# Patient Record
Sex: Female | Born: 1950
Health system: Southern US, Community
[De-identification: ages and names within clinical notes are randomized; demographics above are authoritative.]

## PROBLEM LIST (undated history)

## (undated) DIAGNOSIS — I10 Essential (primary) hypertension: Secondary | ICD-10-CM

## (undated) DIAGNOSIS — N200 Calculus of kidney: Secondary | ICD-10-CM

## (undated) HISTORY — DX: Essential (primary) hypertension: I10

## (undated) HISTORY — DX: Calculus of kidney: N20.0

## (undated) HISTORY — PX: LAPAROSCOPY: SHX197

---

## 1998-12-29 HISTORY — PX: OTHER SURGICAL HISTORY: SHX169

## 1999-06-13 ENCOUNTER — Other Ambulatory Visit: Admission: RE | Admit: 1999-06-13 | Discharge: 1999-06-13 | Payer: Self-pay | Admitting: Obstetrics and Gynecology

## 2000-03-31 ENCOUNTER — Encounter: Admission: RE | Admit: 2000-03-31 | Discharge: 2000-03-31 | Payer: Self-pay | Admitting: Urology

## 2000-03-31 ENCOUNTER — Encounter: Payer: Self-pay | Admitting: Urology

## 2000-08-11 ENCOUNTER — Other Ambulatory Visit: Admission: RE | Admit: 2000-08-11 | Discharge: 2000-08-11 | Payer: Self-pay | Admitting: Obstetrics and Gynecology

## 2001-08-12 ENCOUNTER — Other Ambulatory Visit: Admission: RE | Admit: 2001-08-12 | Discharge: 2001-08-12 | Payer: Self-pay | Admitting: *Deleted

## 2001-08-13 ENCOUNTER — Encounter: Payer: Self-pay | Admitting: Urology

## 2001-08-13 ENCOUNTER — Encounter: Admission: RE | Admit: 2001-08-13 | Discharge: 2001-08-13 | Payer: Self-pay | Admitting: Urology

## 2003-04-04 ENCOUNTER — Other Ambulatory Visit: Admission: RE | Admit: 2003-04-04 | Discharge: 2003-04-04 | Payer: Self-pay | Admitting: Obstetrics and Gynecology

## 2004-09-19 ENCOUNTER — Other Ambulatory Visit: Admission: RE | Admit: 2004-09-19 | Discharge: 2004-09-19 | Payer: Self-pay | Admitting: Obstetrics and Gynecology

## 2005-01-09 ENCOUNTER — Encounter (INDEPENDENT_AMBULATORY_CARE_PROVIDER_SITE_OTHER): Payer: Self-pay | Admitting: Specialist

## 2005-01-09 ENCOUNTER — Ambulatory Visit (HOSPITAL_COMMUNITY): Admission: RE | Admit: 2005-01-09 | Discharge: 2005-01-09 | Payer: Self-pay | Admitting: Gastroenterology

## 2005-10-21 ENCOUNTER — Other Ambulatory Visit: Admission: RE | Admit: 2005-10-21 | Discharge: 2005-10-21 | Payer: Self-pay | Admitting: Obstetrics and Gynecology

## 2005-12-10 ENCOUNTER — Encounter: Payer: Self-pay | Admitting: Cardiology

## 2005-12-10 ENCOUNTER — Ambulatory Visit: Payer: Self-pay | Admitting: Cardiology

## 2005-12-10 ENCOUNTER — Inpatient Hospital Stay (HOSPITAL_COMMUNITY): Admission: EM | Admit: 2005-12-10 | Discharge: 2005-12-11 | Payer: Self-pay | Admitting: Emergency Medicine

## 2006-01-02 ENCOUNTER — Ambulatory Visit: Payer: Self-pay | Admitting: Internal Medicine

## 2006-02-05 ENCOUNTER — Ambulatory Visit: Payer: Self-pay | Admitting: Internal Medicine

## 2006-08-06 ENCOUNTER — Ambulatory Visit: Payer: Self-pay | Admitting: Internal Medicine

## 2006-12-04 ENCOUNTER — Other Ambulatory Visit: Admission: RE | Admit: 2006-12-04 | Discharge: 2006-12-04 | Payer: Self-pay | Admitting: Obstetrics & Gynecology

## 2006-12-11 ENCOUNTER — Ambulatory Visit: Payer: Self-pay | Admitting: Cardiology

## 2007-05-03 ENCOUNTER — Encounter: Admission: RE | Admit: 2007-05-03 | Discharge: 2007-05-03 | Payer: Self-pay | Admitting: Obstetrics and Gynecology

## 2007-07-01 ENCOUNTER — Ambulatory Visit: Payer: Self-pay | Admitting: Internal Medicine

## 2008-06-05 ENCOUNTER — Encounter: Admission: RE | Admit: 2008-06-05 | Discharge: 2008-06-05 | Payer: Self-pay | Admitting: Obstetrics and Gynecology

## 2008-07-31 ENCOUNTER — Ambulatory Visit: Payer: Self-pay | Admitting: Internal Medicine

## 2008-08-08 ENCOUNTER — Other Ambulatory Visit: Admission: RE | Admit: 2008-08-08 | Discharge: 2008-08-08 | Payer: Self-pay | Admitting: Obstetrics and Gynecology

## 2009-05-14 ENCOUNTER — Telehealth: Payer: Self-pay | Admitting: Internal Medicine

## 2009-05-14 ENCOUNTER — Ambulatory Visit: Payer: Self-pay | Admitting: Cardiovascular Disease

## 2009-05-14 DIAGNOSIS — R072 Precordial pain: Secondary | ICD-10-CM | POA: Insufficient documentation

## 2009-05-31 ENCOUNTER — Encounter: Payer: Self-pay | Admitting: Cardiology

## 2009-05-31 ENCOUNTER — Ambulatory Visit: Payer: Self-pay | Admitting: Internal Medicine

## 2009-06-01 DIAGNOSIS — I10 Essential (primary) hypertension: Secondary | ICD-10-CM | POA: Insufficient documentation

## 2009-06-11 ENCOUNTER — Encounter: Payer: Self-pay | Admitting: Cardiovascular Disease

## 2009-06-11 ENCOUNTER — Ambulatory Visit: Payer: Self-pay

## 2009-06-26 ENCOUNTER — Ambulatory Visit: Payer: Self-pay | Admitting: Internal Medicine

## 2009-09-30 ENCOUNTER — Observation Stay (HOSPITAL_COMMUNITY): Admission: EM | Admit: 2009-09-30 | Discharge: 2009-10-01 | Payer: Self-pay | Admitting: Emergency Medicine

## 2009-09-30 ENCOUNTER — Ambulatory Visit: Payer: Self-pay | Admitting: Cardiology

## 2009-10-17 ENCOUNTER — Ambulatory Visit: Payer: Self-pay | Admitting: Internal Medicine

## 2011-04-03 LAB — BASIC METABOLIC PANEL
BUN: 5 mg/dL — ABNORMAL LOW (ref 6–23)
CO2: 24 mEq/L (ref 19–32)
CO2: 26 mEq/L (ref 19–32)
CO2: 28 mEq/L (ref 19–32)
Calcium: 9.1 mg/dL (ref 8.4–10.5)
Calcium: 9.5 mg/dL (ref 8.4–10.5)
Calcium: 9.6 mg/dL (ref 8.4–10.5)
Chloride: 104 mEq/L (ref 96–112)
Chloride: 107 mEq/L (ref 96–112)
Chloride: 109 mEq/L (ref 96–112)
Creatinine, Ser: 0.58 mg/dL (ref 0.4–1.2)
Creatinine, Ser: 0.65 mg/dL (ref 0.4–1.2)
GFR calc Af Amer: >60 mL/min (ref >60)
GFR calc Af Amer: >60 mL/min (ref >60)
GFR calc non Af Amer: >60 mL/min (ref >60)
GFR calc non Af Amer: >60 mL/min (ref >60)
GFR calc non Af Amer: >60 mL/min (ref >60)
Glucose, Bld: 102 mg/dL — ABNORMAL HIGH (ref 70–99)
Glucose, Bld: 111 mg/dL — ABNORMAL HIGH (ref 70–99)
Glucose, Bld: 116 mg/dL — ABNORMAL HIGH (ref 70–99)
Glucose, Bld: 116 mg/dL — ABNORMAL HIGH (ref 70–99)
Potassium: 3.4 mEq/L — ABNORMAL LOW (ref 3.5–5.1)
Sodium: 140 mEq/L (ref 135–145)
Sodium: 142 mEq/L (ref 135–145)

## 2011-04-03 LAB — CBC
Hemoglobin: 11.9 g/dL — ABNORMAL LOW (ref 12.0–15.0)
Hemoglobin: 12.3 g/dL (ref 12.0–15.0)
Hemoglobin: 12.8 g/dL (ref 12.0–15.0)
MCHC: 33.8 g/dL (ref 30.0–36.0)
MCV: 96.6 fL (ref 78.0–100.0)
MCV: 97 fL (ref 78.0–100.0)
Platelets: 213 10*3/uL (ref 150–400)
Platelets: 234 10*3/uL (ref 150–400)
Platelets: 241 10*3/uL (ref 150–400)
RBC: 3.63 MIL/uL — ABNORMAL LOW (ref 3.87–5.11)
RDW: 12.3 % (ref 11.5–15.5)
RDW: 12.7 % (ref 11.5–15.5)

## 2011-04-03 LAB — DIFFERENTIAL
Basophils Relative: 1 % (ref 0–1)
Eosinophils Absolute: 0.1 10*3/uL (ref 0.0–0.7)
Eosinophils Relative: 1 % (ref 0–5)
Lymphocytes Relative: 46 % (ref 12–46)
Lymphs Abs: 3.8 10*3/uL (ref 0.7–4.0)
Monocytes Absolute: 0.7 10*3/uL (ref 0.1–1.0)
Monocytes Relative: 9 % (ref 3–12)
Neutrophils Relative %: 45 % (ref 43–77)

## 2011-04-03 LAB — PROTIME-INR
INR: 1.1 (ref 0.00–1.49)
Prothrombin Time: 13.7 seconds (ref 11.6–15.2)

## 2011-04-03 LAB — CARDIAC PANEL(CRET KIN+CKTOT+MB+TROPI)
Relative Index: INVALID (ref 0.0–2.5)
Total CK: 43 U/L (ref 7–177)
Troponin I: 0.02 ng/mL (ref 0.00–0.06)

## 2011-04-03 LAB — CK TOTAL AND CKMB (NOT AT ARMC): Total CK: 43 U/L (ref 7–177)

## 2011-04-03 LAB — POCT CARDIAC MARKERS

## 2011-05-13 NOTE — Assessment & Plan Note (Signed)
[facility]                            CARDIOLOGY OFFICE NOTE   Jeanne Wilson, Jeanne Wilson                        MRN:          [id number]  DATE:07/31/2008                            DOB:          July 12, 1951    PRIMARY CARE PHYSICIAN:  Jeanne Likes, MD.   INTERVAL HISTORY:  Jeanne Wilson is a very pleasant 60 year old woman with a  history of chest pain and a very abnormal EKG.  She had a cardiac  catheterization in December 2006, which showed normal coronaries and  good LV function.  She also has a history of borderline hypertension and  follows with Dr. Lorenz Wilson.   She returns today for routine followup.  We have not seen her since  December 2007.  She is doing great.  She is active with yoga and low-  impact aerobics without any chest pain or shortness of breath.  She has  been taking her blood pressures at home and systolic had been running  from 128 to 132.   CURRENT MEDICATIONS:  1. Aspirin 81.  2. Prilosec 20 a day.   PHYSICAL EXAMINATION:  VITAL SIGNS:  She is well appearing in no acute  distress, ambulatory on the clinic without any respiratory difficulty.  VITAL SIGNS:  Blood pressure is 135/70, heart rate is 62, weight is 138.  HEENT:  Normal.  NECK:  Supple.  No JVD.  Carotids are 2+ bilaterally.  No bruits.  There  is no lymphadenopathy or thyromegaly.  CARDIAC:  She has a regular rate and rhythm.  No murmurs, rubs, or  gallops.  LUNGS:  Clear.  ABDOMEN:  Soft, nontender, nondistended.  No hepatosplenomegaly, no  bruits, no masses.  Good bowel sounds.  EXTREMITIES:  Warm with no cyanosis, clubbing, or edema.  No cords.  No  rash.  NEURO:  Alert and x3.  Cranial nerves II-XII are intact.  Moves all 4  extremities without difficulty.  Affect is very pleasant.   EKG shows normal sinus rhythm at a rate of 62.  There is LVH and marked  repolarization abnormalities with diffuse ST depression throughout.   ASSESSMENT AND PLAN:  1. Chest pain  with markedly abnormal EKG.  She has normal coronaries.      She is asymptomatic.  She is keeping an EKG in her wallet.      Continue low-dose aspirin.  2. Hypertension.  This is borderline.  We will continue following her      blood pressures at home.  Should her systolic blood pressure be      consistently over 130 or 135, I do think it is reasonable to start      back on low-dose Norvasc at 2.5 a day.  She will follow up with Dr.      Lorenz Wilson.   DISPOSITION:  She is doing great.  We will see her back in 18 months for  routine followup.     Jeanne Buckles. Bensimhon, MD  Electronically Signed    DRB/MedQ  DD: 07/31/2008  DT: 08/01/2008  Job #: [id number]   cc:  Jeanne Wilson, M.D.

## 2011-05-13 NOTE — Assessment & Plan Note (Signed)
[facility]                            CARDIOLOGY OFFICE NOTE   BEYOUNCE, DICKENS                        MRN:          [id number]  DATE:07/01/2007                            DOB:          February 12, 1951    PRIMARY CARE PHYSICIAN:  [facility] [address]   HISTORY OF PRESENT ILLNESS:  Ms. Don is a very pleasant 60 year old  woman with a history of chest pain with normal coronary arteries by  catheterization in 2006, questionable history of coronary vasospasm. She  was seen in our office in December 2007 with recurrent chest pain. That  is the time that her mother was very sick. She had been restarted on her  Norvasc and Imdur but stopped this after 2 weeks. She has not taken it  since. She says that she feels fine. She is very active with no chest  pain. No shortness of breath.   CURRENT MEDICATIONS:  Aspirin 81, Prilosec 20.   PHYSICAL EXAMINATION:  GENERAL:  She is well appearing. No acute  distress. Ambulates around the clinic without any respiratory  difficulty.  VITAL SIGNS:  Blood pressure 130/70, however, she says at home it is  always 105 to the 1-teens. Heart rate 69. Weigh 142.  HEENT:  Normal.  NECK:  Supple. No JVD. Carotids are 2+ bilaterally without any bruits.  There is no lymphadenopathy or thyromegaly.  CARDIAC:  Regular rate and rhythm. No murmur, rub, or gallop.  LUNGS:  Clear.  ABDOMEN:  Soft, nontender, and nondistended. No hepatosplenomegaly. No  bruits, no masses. Good bowel sounds.  EXTREMITIES:  Warm with no clubbing, cyanosis, or edema.  NEUROLOGIC:  Alert and oriented times three. Cranial nerves 2-12 are  intact. Moves all 4 extremities without difficulty. Affect is very  pleasant.   LABORATORY DATA:  EKG shows normal sinus rhythm at a rate of 69 with LVH  and diffuse STT wave changes, suggestive of repolarization  abnormalities.   ASSESSMENT/PLAN:  1. Chest pain, possibly secondary to coronary vasospasm. This  has      resolved. No further workup needed at this time. She does have prn      nitroglycerin and I reminded her to refresh this, to keep a fresh      supply in the house.  2. Hypertension. Blood pressure's on the high end of normal here but      well controlled at home. She will followup with her primary care      physician.   DISPOSITION:  Return to clinic in 1 year or as needed.     Bevelyn Buckles. Bensimhon, MD  Electronically Signed    DRB/MedQ  DD: 07/01/2007  DT: 07/02/2007  Job #: [id number]   cc:   [facility], [address]

## 2011-05-16 NOTE — Cardiovascular Report (Signed)
[name], Jeanne Wilson                 ACCOUNT NO.:  [id number]   MEDICAL RECORD NO.:  [id number]          PATIENT TYPE:  INP   LOCATION:  2925                         FACILITY:  [facility]   PHYSICIAN:  Arturo Morton. Riley Kill, M.D. [facility] OF BIRTH:  [birth year]   DATE OF PROCEDURE:  12/10/2005  DATE OF DISCHARGE:  12/11/2005                              CARDIAC CATHETERIZATION   INDICATIONS:  The patient was brought to the catheterization laboratory  urgently by Dr. Gala Romney. She presented with chest pain and symptoms  worrisome for cardiac etiology. The current study was done to assess  coronary anatomy.   DESCRIPTION OF PROCEDURE:  The patient was brought to the catheterization  laboratory after informed consent. She was prepped and draped in the usual  fashion. Through an anterior puncture, the right femoral artery was easily  entered. A 6-French sheath was placed. She was given 600 milligrams of oral  clopidogrel by the protocol. Views of the right and left coronary arteries  were obtained in multiple angiographic projections. Central aortic and left  ventricular pressures were measured with pigtail. Dr. Gala Romney and I have  reviewed the films. The patient was transferred to the holding area in  satisfactory clinical condition.   HEMODYNAMIC DATA:  1.  Central aortic pressure was 152/79, mean 110.  2.  Left ventricular pressure 149/17.  3.  No gradient on pullback across the aortic valve.   ANGIOGRAPHIC DATA:  1.  The right coronary was a nondominant vessel that was smooth throughout      its course without significant focal narrowing.  2.  The left main demonstrated a very short left main and was without      significant narrowing.  3.  The circumflex is a dominant vessel. There is a major marginal branch      that bifurcates distally over the anterolateral segment and was free of      significant disease. The distal vessel terminated as two posterolateral      branches in the PDA  coming from the left system, and no significant      narrowing was noted.  4.  The left anterior descending artery provided a major bifurcating      diagonal branch. The diagonal branch was free of critical disease. The      LAD itself was really quite tortuous but again there was not obvious      abnormality.  5.  Ventriculography was associated with some ectopy during injection making      calculation of ejection fraction formally and possible; however, there      was no definite wall motion abnormality.   CONCLUSION:  1.  Preserved overall left ventricular function.  2.  Left dominant system with no obvious high-grade critical obstruction.   The patient will be taken to the holding area for convalescent care. Serial  enzymes and D-dimer will be obtained. The patient may need to have a CT scan  to evaluate her aorta. On the angiogram, there is no obvious evidence of  aortic dissection and the overall aortic caliber appears to be normal.  Further evaluation will be directed by Dr. Gala Romney.      Arturo Morton. Riley Kill, M.D. [facility]  Electronically Signed     TDS/MEDQ  D:  01/10/2006  T:  01/10/2006  Job:  [id number]

## 2011-05-16 NOTE — H&P (Signed)
Jeanne Wilson NO.:  [id number]   MEDICAL RECORD NO.:  [id number]          PATIENT TYPE:  EMS   LOCATION:  MAJO                         FACILITY:  [facility]   PHYSICIAN:  Arvilla Meres, M.D. LHCDATE OF BIRTH:  June 15, 1951   DATE OF ADMISSION:  12/10/2005  DATE OF DISCHARGE:                                HISTORY & PHYSICAL   PRIMARY CARDIOLOGIST:  Dr. Arvilla Meres (new).   REASON FOR ADMISSION:  Jeanne Wilson is a 60 year old female with no prior year old female with no prior  cardiac history and no significant cardiac risk factors who presents to the  emergency room with a 1-week history of intermittent chest pain associated  with right arm discomfort. Electrocardiogram on admission is suggestive of  anteroseptal myocardial infarction.   The patient states that for the last week or so she has felt a fluttering  sensation in the middle of the chest which is then followed by discomfort  radiating to her right upper extremity and into the right shoulder. This  appears to be precipitated by exertion. She initially thought that this  might be bronchitis and was experiencing a nonproductive cough as well. She  was recently placed on a 1-week course of Z-Pak which she completed.  However, her symptoms have persisted and this morning, at 3 a.m., she awoke  with her worst episode of chest discomfort (7/10). She drove herself to the  emergency room and electrocardiogram revealed normal sinus rhythm with 1 mm  ST elevation with coving in leads V1 and V2, and 1-2 mm ST depression in the  inferolateral leads.   ALLERGIES:  No known drug allergies.   MEDICATIONS PRIOR TO ADMISSION:  None.   PAST MEDICAL HISTORY:  History of nephrolithiasis status post lithotripsy.   SOCIAL HISTORY:  The patient lives here in [place] with her husband. She  has two boys. She is a [employer] trader.   FAMILY HISTORY:  Mother age 58, status post bilateral carotid  endarterectomy. Father age 75, no known heart disease.  Siblings have no  known heart disease.   REVIEW OF SYSTEMS:  The patient denies any history of hypertension, diabetes  mellitus, hypercholesterolemia. She denies any recent paroxysmal nocturnal  dyspnea, orthopnea, or lower extremity edema. No recent evidence of upper or  lower GI bleeding. The patient denies recent fever, chills, or productive  cough. Remaining symptoms are negative.   PHYSICAL EXAMINATION:  VITAL SIGNS:  Blood pressure 123/74, pulse 74 and  regular, respirations 18, saturations 96% on room air, temperature 98.1.  GENERAL:  A 60 year old female in no apparent distress.  HEENT:  Normocephalic, atraumatic.  NECK:  Preserved bilateral carotid pulses without bruits; no JVD.  LUNGS:  Clear to auscultation in all fields.  HEART:  Regular rate and rhythm (S1, S2). No murmurs, rubs, or gallops.  ABDOMEN:  Soft, nontender.  EXTREMITIES:  Intact peripheral pulses without edema.  NEUROLOGIC:  No focal deficit.   LABORATORY DATA:  Pending.   Electrocardiogram:  Normal sinus rhythm at 70 beats per minute with normal  axis; 1 mm ST elevation in leads V1 and V2 with coving; 1-2  mm ST depression  in the inferolateral leads.   IMPRESSION:  Jeanne Wilson is a 60 year old female with no prior cardiac history  and no significant cardiac risk factors who presents to the emergency room  with new-onset chest discomfort which has been intermittent over the last  week. She experienced her worst episode at 3 a.m. this morning and presented  to the emergency room with an electrocardiogram suggestive of anteroseptal  myocardial infarction. The patient has been treated with baby aspirin and  has been started on IV nitroglycerin and heparin in the emergency room.   PLAN:  Proceed directly to the cardiac catheterization laboratory for  emergent coronary angiogram and possible percutaneous intervention.      Gene Serpe, P.A. [facility]      Arvilla Meres, M.D. [facility]  Electronically Signed     GS/MEDQ  D:  12/10/2005  T:  12/10/2005  Job:  [id number]   cc:   [facility] Mis on [address]

## 2011-05-16 NOTE — Op Note (Signed)
Jeanne Wilson, Jeanne Wilson                 ACCOUNT NO.:  [id number]   MEDICAL RECORD NO.:  [id number]          PATIENT TYPE:  AMB   LOCATION:  ENDO                         FACILITY:  [facility]   PHYSICIAN:  Graylin Shiver, M.D.   DATE OF BIRTH:  [birth year]   DATE OF PROCEDURE:  01/09/2005  DATE OF DISCHARGE:                                 OPERATIVE REPORT   INDICATIONS:  Family history of colon polyps.   Informed consent was obtained after explanation of the risks of bleeding,  infection and perforation.   PREMEDICATION:  Fentanyl 100 mcg IV, Versed 10 milligrams IV.   PROCEDURE:  With the patient in the left lateral decubitus position, a  rectal exam was performed. No masses were felt. The Olympus colonoscope was  inserted into the rectum and advanced around the colon to the cecum. Cecal  landmarks were identified. The ileocecal valve was intubated and the first  few centimeters of terminal ileum looked normal. The cecum looked normal.  The ascending colon looked normal. The transverse colon looked normal. The  descending colon looked normal. The sigmoid showed a small 3 mm polyp  biopsied off with cold forceps. The rectum was normal. She tolerated the  procedure well without complications.   IMPRESSION:  Small sigmoid polyp.   PLAN:  The pathology will be checked.   I would recommend a follow-up colonoscopy again in 5 years.       SFG/MEDQ  D:  01/09/2005  T:  01/09/2005  Job:  [id number]   cc:   Andres Ege, M.D.  [address]  [place]  [place] [address]  Fax: [phone]

## 2011-05-16 NOTE — Assessment & Plan Note (Signed)
[facility]                              CARDIOLOGY OFFICE NOTE   IMELDA, DANDRIDGE                          MRN:          [id number]  DATE:08/06/2006                            DOB:          [birth year]    PRIMARY CARE PHYSICIAN:  [facility] [address]   PATIENT IDENTIFICATION:  Jeanne Wilson is a delightful 60 year old woman with  chest pain possibly secondary to coronary vasospasm - returns for routine  followup.   PROBLEM:  1. Severe chest pain with abnormal EKG.  Question coronary vasospasm.      a.     Cardiac catheterization summer 2006.  Normal coronary arteries.       Normal ejection fraction.      b.     Echocardiogram November 2006.  Ejection fraction 55 to 65%. No       significant valvular abnormalities.      c.     Chest CT December 2006.  No pulmonary emboli or masses.      d.     Chest pain resolved with Norvasc and Imdur.  2. Hypertension.  3. Normal cholesterol.  4. Kidney stones.   CURRENT MEDICATIONS:  1. Aspirin 81.  2. Prilosec 20.  3. Imdur 30.  4. Norvasc 5.   ALLERGIES:  No known drug allergies.   INTERVAL HISTORY:  Jeanne Wilson returns today for routine followup.  She has  done quite well.  Very occasional twinges in her chest but much reduced.  No  shortness of  breath.  She is exercising with aerobics and belly dancing.  She is asking whether or not she can come off some of her medication.   PHYSICAL EXAMINATION:  GENERAL:  Well appearing, no acute distress.  VITAL SIGNS:  Blood pressure is 116/74.  Heart rate 73.  HEENT:  Sclerae anicteric.  EOMI.  NECK:  Supple, no JVD.  Carotids 2+ bilaterally without bruits.  No  lymphadenopathy, thyromegaly.  CARDIAC:  Regular rate and rhythm.  Soft S4.  No murmurs.  LUNGS:  Clear.  ABDOMEN:  Soft, non-tender, non-distended.  No hepatosplenomegaly, no  bruits, no masses.  EXTREMITIES:  Warm.  No cyanosis, clubbing or edema.  Strong peripheral  pulses.  NEUROLOGIC:  She  is alert and oriented x3.  Cranial nerves II-XII are  intact.  She moves all four extremities without any difficulty.  Affect is  bright.   ASSESSMENT/PLAN:  1. Chest pain with __________ .  She has had a dramatic response to      Norvasc and Imdur.  I am not sure if a relief is related to coronary      spasm or esophageal spasm.  We will try to wean off of her Imdur and      see if we can just maintain her on her Norvasc, which will also control      her hypertension.  I told her that if this pain comes back she could      just go back on the Imdur.  2. Hypertension - well controlled.  Continue  Norvasc.  3. Lipids - we will check a cholesterol today.   DISPOSITION:  We will see her back in nine months for a routine followup.                                Bevelyn Buckles. Bensimhon, MD    DRB/MedQ  DD:  08/06/2006  DT:  08/06/2006  Job #:  [id number]

## 2011-05-16 NOTE — Assessment & Plan Note (Signed)
[facility]                            CARDIOLOGY OFFICE NOTE   Jeanne Wilson, Jeanne Wilson                        MRN:          [id number]  DATE:12/11/2006                            DOB:          1951-02-01    PRIMARY CARE PHYSICIAN:  [facility].   PRIMARY CARDIOLOGIST:  Bevelyn Buckles. Bensimhon, M.D.   HISTORY OF PRESENT ILLNESS:  Jeanne Wilson is a 60 year old female patient  with a history of chest pain, possibly secondary to coronary vasospasm  who returns to the office today for followup for the Horizon study.  She  had a normal cardiac catheterization in December 2006 as well as good  left ventricular function by echocardiogram with no significant  abnormalities and a normal chest CT in December 2006.  She has been no  Norvasc and Imdur in the past with resolution of her chest pain.  When  she last saw Dr. Gala Romney, he asked her to come off of her Imdur but  she actually came off both her medications and actually had been doing  well up until this past week.  She has noted a recurrence of her  previous chest pain.  This is a substernal ache that often wakes her up  in the middle of the night with radiation to her right arm.  Now she  sometimes has some radiation to her left arm.  She denies associated  shortness of breath, nausea, diaphoresis.  Denies any syncope or  presyncope.  She has had a recent bout of head and chest congestion and  did feel a little lightheaded with changes in head positioning today.  Otherwise she denies fevers or chills.   CURRENT MEDICATIONS:  1. Aspirin 81 mg daily.  2. Prilosec 20 mg daily.   ALLERGIES:  No known drug allergies.   SOCIAL HISTORY:  She denies tobacco abuse.   PHYSICAL EXAMINATION:  GENERAL:  Well-nourished, well-developed female  in no distress.   REVIEW OF SYSTEMS:  See the HPI.  Denies any fevers, chills, melena,  hematochezia, hematuria, dysuria.  She does have chronic cough.  This is  not productive.   She denies hemoptysis.  She does note waterbrash  symptoms as well as daily indigestion.  Other review of systems  negative.   PHYSICAL EXAMINATION:  GENERAL:  She is a well-nourished, well-developed  female.  VITAL SIGNS:  Blood pressure 126/71, pulse 63, weight 137 pounds.  HEENT: Unremarkable.  NECK:  Without JVD.  CARDIAC:  S1 and S2.  Regular rate and rhythm.  LUNGS:  Clear to auscultation.  ABDOMEN:  Soft, nontender.  EXTREMITIES:  Without edema.  Calves are soft, nontender.   Electrocardiogram reveals sinus rhythm with a heart rate of 63.  Normal  axis.  ST depression in II, III, and aVF and V3-6.  No significant  change since previous tracing.   IMPRESSION:  1. Chest pain.  Possibly secondary to coronary vasospasm.  2. Hypertension.   PLAN:  The patient presents as part of the Horizon followup.  However,  today she is complaining of recurrence of her chest pain. This  is the  same pain she had before starting on Imdur and Norvasc.  She has been  off her medications.  I have asked her to go ahead and get back on her  Norvasc at 5 mg half a tablet a day.  After a couple of days, she can  increase this to 5 mg a day.  If she continues to have symptoms after  this and she is not having significant side effects from her  medications, she can go back on Imdur half a tablet a day.  She can  titrate up to a whole tablet a day after that if needed.  She can  followup with Dr. Gala Romney in the next two or three weeks.  I have  given her sublingual nitroglycerin as well.  She knows to contact us if  she has worsening of her symptoms.      Tereso Newcomer, PA-C  Electronically Signed      Pricilla Riffle, MD, [facility]  Electronically Signed   SW/MedQ  DD: 12/11/2006  DT: 12/11/2006  Job #: [id number]

## 2011-05-16 NOTE — Discharge Summary (Signed)
NAMEMELA, PERHAM                 ACCOUNT NO.:  [id number]   MEDICAL RECORD NO.:  [id number]          PATIENT TYPE:  INP   LOCATION:  2925                         FACILITY:  [facility]   PHYSICIAN:  Arvilla Meres, M.D. LHCDATE OF BIRTH:  September 17, 1951   DATE OF ADMISSION:  12/10/2005  DATE OF DISCHARGE:  12/11/2005                                 DISCHARGE SUMMARY   PROCEDURE:  Emergent cardiac catheterization on December 10, 2005.   PRIMARY DIAGNOSES:  1.  Nonischemic chest pain.      1.  Normal coronary arteries by emergent cardiac catheterization on          December 10, 2005.  2.  False-positive baseline electrocardiogram.   SECONDARY DIAGNOSIS:  History of nephrolithiasis, status post lithotripsy.   REASON FOR ADMISSION:  Ms. Jeanne Wilson is a 60 year old female with no prior  cardiac history, who presented to the emergency room with new onset chest  pain and was found to have an abnormal electrocardiogram suggestive of acute  anteroseptal myocardial infarction. Laboratory data was normal. Serial  cardiac enzymes, D-dimer less than 0.02. Lipid profile: Total cholesterol  150, triglycerides 164, HDL 41, LDL 76. TSH 0.87. Electrolytes and renal  function were normal.  Liver enzymes were normal. Glucose was 93 on  admission. CBC was normal.   Admission chest x-ray: Normal.   2-D echocardiogram: EF 55% to 65%; inadequate for evaluation of LV regional  wall motion; no valvular abnormalities; trivial mitral regurgitation.   HOSPITAL COURSE:  Following presentation to the emergency room with new  onset chest pain and an abnormal electrocardiogram suggestive of acute  anteroseptal myocardial infarction, the patient was treated with aspirin,  intravenous nitroglycerin, and heparin, and taken directly to the cardiac  catheterization laboratory for emergent coronary angiography.   Cardiac catheterization, however, revealed normal coronary arteries.   Dr. Riley Kill thus ordered further  diagnostic tests with a D-dimer (less than  0.022) and a STAT echocardiogram, the latter revealing normal left  ventricular function and no structural abnormalities. A STAT ABG also was  unrevealing.   The patient was seen in follow up by Dr. Gala Romney with the final  recommendations to proceed with a CT scan of the chest to exclude presence  of a mass or other abnormalities. If negative, then no further workup is  recommended. Of note,  instructions are to provide the patient with a copy  of her electrocardiogram given that it was a false positive.   At the time of dictation the patient was still awaiting the CT scan of the  chest.   MEDICATIONS AT DISCHARGE:  1.  Coated aspirin 81 mg daily.  2.  Prilosec 20 mg daily.   INSTRUCTIONS:  1.  Follow up with Dr. Arvilla Meres on January 5th at 2:15 p.m.  2.  Refer to cardiac catheterization discharge sheet for instructions.  3.  In the event of any recurrent chest pain in the future, the patient is      to bring with her a copy of her baseline electrocardiogram.   DISCHARGE DURATION INCLUDING PHYSICIAN TIME:  28 minutes.  Gene Serpe, P.A. [facility]      Arvilla Meres, M.D. [facility]  Electronically Signed    GS/MEDQ  D:  12/11/2005  T:  12/13/2005  Job:  [id number]   cc:   [facility]  [address]

## 2011-06-02 ENCOUNTER — Encounter: Payer: Self-pay | Admitting: Internal Medicine

## 2011-06-23 ENCOUNTER — Ambulatory Visit (INDEPENDENT_AMBULATORY_CARE_PROVIDER_SITE_OTHER): Payer: BC Managed Care – PPO | Admitting: Internal Medicine

## 2011-06-23 ENCOUNTER — Encounter: Payer: Self-pay | Admitting: Internal Medicine

## 2011-06-23 VITALS — BP 124/78 | HR 57 | Ht 64.0 in | Wt 130.0 lb

## 2011-06-23 DIAGNOSIS — I10 Essential (primary) hypertension: Secondary | ICD-10-CM

## 2011-06-23 DIAGNOSIS — R072 Precordial pain: Secondary | ICD-10-CM

## 2011-06-23 NOTE — Assessment & Plan Note (Signed)
Blood pressure well controlled. Continue current regimen.  

## 2011-06-23 NOTE — Progress Notes (Signed)
PCP: Dr. Drenda Freeze [facility] [place])   HPI:  Jeanne Wilson is a 60 year old woman with a history of chest pain and a very abnormal EKG.  She had a cardiac catheterization in December 2006, which showed normal coronaries and good LV function.  She also has a history of borderline hypertension and follows with Dr. Lorenz Coaster.  She was admitted for recurrent CP in 10/10 had recent stress test with ? of lateral wall ischemia so underwent cath which agained showed normal cors and normal EF.  Has not had any further CP. Feels it was related to family stress. Starting to get back to routine activites. No sob.   ROS: All systems negative except as listed in HPI, PMH and Problem List.  Past Medical History  Diagnosis Date  . Chest pain     with abnormal EKG. cardiac cath 12/06- normal coronaries, normal EF. cath 10/10- normal coronaries and EF  . HTN (hypertension)   . Kidney stones     Current Outpatient Prescriptions  Medication Sig Dispense Refill  . aspirin 81 MG EC tablet Take 81 mg by mouth daily.        . Calcium Carbonate-Vit D-Min (CALCIUM 1200 PO) Take 1 tablet by mouth daily.        . Cholecalciferol (VITAMIN D) 2000 UNITS CAPS Take by mouth.        . ibandronate (BONIVA) 150 MG tablet Take 150 mg by mouth every 30 (thirty) days. Take in the morning with a full glass of water, on an empty stomach, and do not take anything else by mouth or lie down for the next 30 min.       . nitroGLYCERIN (NITROSTAT) 0.4 MG SL tablet Place 0.4 mg under the tongue every 5 (five) minutes as needed.        . pantoprazole (PROTONIX) 20 MG tablet Take 20 mg by mouth daily.        . Potassium Gluconate 550 MG TABS Take 1 tablet by mouth daily.        Marland Kitchen amLODipine (NORVASC) 2.5 MG tablet Take 2.5 mg by mouth daily.           PHYSICAL EXAM: Filed Vitals:   06/23/11 1402  BP: 124/78  Pulse: 57   General:  Well appearing. No resp difficulty HEENT: normal Neck: supple. JVP flat. Carotids 2+ bilaterally; no  bruits. No lymphadenopathy or thryomegaly appreciated. Cor: PMI normal. Regular rate & rhythm. No rubs, gallops or murmurs. Lungs: clear Abdomen: soft, nontender, nondistended. No hepatosplenomegaly. No bruits or masses. Good bowel sounds. Extremities: no cyanosis, clubbing, rash, edema Neuro: alert & orientedx3, cranial nerves grossly intact. Moves all 4 extremities w/o difficulty. Affect pleasant.   ECG: NSR 66 LVH with diffuse ST abnormalities. 1AVB   ASSESSMENT & PLAN:

## 2011-06-23 NOTE — Assessment & Plan Note (Signed)
Resolved. Cath with normal cors. No further work-up at this time.

## 2011-08-15 ENCOUNTER — Other Ambulatory Visit: Payer: Self-pay | Admitting: *Deleted

## 2011-08-15 MED ORDER — NITROGLYCERIN 0.4 MG SL SUBL: 0.4000 mg | SUBLINGUAL_TABLET | SUBLINGUAL | Status: DC | PRN

## 2011-10-23 ENCOUNTER — Other Ambulatory Visit: Payer: Self-pay | Admitting: Family Medicine

## 2011-10-23 ENCOUNTER — Other Ambulatory Visit (HOSPITAL_COMMUNITY)
Admission: RE | Admit: 2011-10-23 | Discharge: 2011-10-23 | Disposition: A | Discharge status: Home / Self Care | Payer: BC Managed Care – PPO | Source: Ambulatory Visit | Attending: Family Medicine | Admitting: Family Medicine

## 2011-10-23 DIAGNOSIS — Z1159 Encounter for screening for other viral diseases: Secondary | ICD-10-CM | POA: Insufficient documentation

## 2011-10-23 DIAGNOSIS — Z124 Encounter for screening for malignant neoplasm of cervix: Secondary | ICD-10-CM | POA: Insufficient documentation

## 2011-11-14 ENCOUNTER — Other Ambulatory Visit: Payer: Self-pay | Admitting: Internal Medicine

## 2012-06-29 ENCOUNTER — Other Ambulatory Visit: Payer: Self-pay | Admitting: Internal Medicine

## 2012-11-08 ENCOUNTER — Other Ambulatory Visit: Payer: Self-pay | Admitting: Obstetrics and Gynecology

## 2012-11-08 ENCOUNTER — Other Ambulatory Visit: Payer: Self-pay | Admitting: Family Medicine

## 2012-11-08 DIAGNOSIS — Z1231 Encounter for screening mammogram for malignant neoplasm of breast: Secondary | ICD-10-CM

## 2012-12-09 ENCOUNTER — Other Ambulatory Visit: Payer: Self-pay | Admitting: Family Medicine

## 2012-12-09 DIAGNOSIS — M810 Age-related osteoporosis without current pathological fracture: Secondary | ICD-10-CM

## 2012-12-16 ENCOUNTER — Ambulatory Visit: Payer: BC Managed Care – PPO

## 2012-12-20 ENCOUNTER — Other Ambulatory Visit: Payer: BC Managed Care – PPO

## 2012-12-30 ENCOUNTER — Other Ambulatory Visit: Payer: Self-pay | Admitting: Family Medicine

## 2012-12-30 DIAGNOSIS — M810 Age-related osteoporosis without current pathological fracture: Secondary | ICD-10-CM

## 2013-01-21 ENCOUNTER — Ambulatory Visit
Admission: RE | Admit: 2013-01-21 | Discharge: 2013-01-21 | Disposition: A | Discharge status: Home / Self Care | Payer: BC Managed Care – PPO | Source: Ambulatory Visit | Attending: Family Medicine | Admitting: Family Medicine

## 2013-01-21 DIAGNOSIS — M810 Age-related osteoporosis without current pathological fracture: Secondary | ICD-10-CM

## 2013-01-21 DIAGNOSIS — Z1231 Encounter for screening mammogram for malignant neoplasm of breast: Secondary | ICD-10-CM

## 2013-03-03 ENCOUNTER — Other Ambulatory Visit: Payer: Self-pay | Admitting: Internal Medicine

## 2013-03-18 ENCOUNTER — Telehealth: Payer: Self-pay | Admitting: Internal Medicine

## 2013-03-18 NOTE — Telephone Encounter (Signed)
New Problem:    Patient called in wanting to know if she was still eligible to see Dr. Gala Wilson in the CHF clinic.  Please let me know if she is not.

## 2013-03-21 NOTE — Telephone Encounter (Signed)
No she is not, she can see any cardiologist thanks

## 2013-03-24 NOTE — Telephone Encounter (Signed)
Thank you.  LMOVM for patient to call in and schedule a new patient consult with one of our other physicians.

## 2013-05-06 ENCOUNTER — Other Ambulatory Visit: Payer: Self-pay | Admitting: Internal Medicine

## 2013-07-05 ENCOUNTER — Other Ambulatory Visit: Payer: Self-pay | Admitting: *Deleted

## 2013-07-05 MED ORDER — NITROGLYCERIN 0.4 MG SL SUBL: 0.4000 mg | SUBLINGUAL_TABLET | SUBLINGUAL | Status: DC | PRN

## 2013-07-05 MED ORDER — AMLODIPINE BESYLATE 2.5 MG PO TABS: ORAL_TABLET | ORAL | Status: DC

## 2013-09-01 ENCOUNTER — Ambulatory Visit (INDEPENDENT_AMBULATORY_CARE_PROVIDER_SITE_OTHER): Payer: BC Managed Care – PPO | Admitting: Cardiology

## 2013-09-01 ENCOUNTER — Encounter: Payer: Self-pay | Admitting: Cardiology

## 2013-09-01 VITALS — BP 126/68 | HR 73 | Ht 64.0 in | Wt 136.0 lb

## 2013-09-01 DIAGNOSIS — I10 Essential (primary) hypertension: Secondary | ICD-10-CM

## 2013-09-01 DIAGNOSIS — R072 Precordial pain: Secondary | ICD-10-CM

## 2013-09-01 MED ORDER — AMLODIPINE BESYLATE 2.5 MG PO TABS: ORAL_TABLET | ORAL | Status: DC

## 2013-09-01 NOTE — Progress Notes (Signed)
   HPI: followup chest pain. Previously followed by Dr. Gala Romney. Echocardiogram in December of 2006 showed normal LV function. Cardiac catheterization in December 2006, which showed normal coronaries and good LV function. Stress test 2010 with question of lateral wall ischemia; cath showed normal cors and normal EF. Patient last seen in June of 2012. Since that time, she denies dyspnea on exertion, orthopnea, PND, pedal edema, palpitations or syncope. She occasionally has chest pain when she is stressed but she exercises routinely with no chest pain.   Current Outpatient Prescriptions  Medication Sig Dispense Refill  . amLODipine (NORVASC) 2.5 MG tablet take 1 tablet by mouth once daily  30 tablet  0  . aspirin 81 MG EC tablet Take 81 mg by mouth daily.        . Calcium Carbonate-Vit D-Min (CALCIUM 1200 PO) Take 1 tablet by mouth daily.        . Cholecalciferol (VITAMIN D) 2000 UNITS CAPS Take by mouth.        . ibandronate (BONIVA) 150 MG tablet Take 150 mg by mouth every 30 (thirty) days. Take in the morning with a full glass of water, on an empty stomach, and do not take anything else by mouth or lie down for the next 30 min.       . nitroGLYCERIN (NITROSTAT) 0.4 MG SL tablet Place 1 tablet (0.4 mg total) under the tongue every 5 (five) minutes as needed.  25 tablet  9  . pantoprazole (PROTONIX) 20 MG tablet Take 20 mg by mouth daily.        . Potassium Gluconate 550 MG TABS Take 1 tablet by mouth daily.         No current facility-administered medications for this visit.     Past Medical History  Diagnosis Date  . Chest pain     with abnormal EKG. cardiac cath 12/06- normal coronaries, normal EF. cath 10/10- normal coronaries and EF  . HTN (hypertension)   . Kidney stones     Past Surgical History  Procedure Laterality Date  . 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 Marital Status: Married    Spouse Name: N/A    Number of Children: N/A   . Years of Education: N/A   Occupational History  . Not on file.   Social History Main Topics  . Smoking status: Never Smoker   . Smokeless tobacco: Never Used  . Alcohol Use: No  . Drug Use: No  . Sexual Activity: Not on file   Other Topics Concern  . Not on file   Social History Narrative   Married, homemaker. Is active in yoga and low-impact aerobics.     ROS: no fevers or chills, productive cough, hemoptysis, dysphasia, odynophagia, melena, hematochezia, dysuria, hematuria, rash, seizure activity, orthopnea, PND, pedal edema, claudication. Remaining systems are negative.  Physical Exam: Well-developed well-nourished in no acute distress.  Skin is warm and dry.  HEENT is normal.  Neck is supple.  Chest is clear to auscultation with normal expansion.  Cardiovascular exam is regular rate and rhythm.  Abdominal exam nontender or distended. No masses palpated. Extremities show no edema. neuro grossly intact  ECG sinus rhythm at a rate of 73. Normal axis. Diffuse ST depression. First degree AV block. Unchanged compared to June of 2012.

## 2013-09-01 NOTE — Patient Instructions (Addendum)
Your physician wants you to follow-up in: ONE YEAR WITH DR CRENSHAW You will receive a reminder letter in the mail two months in advance. If you don't receive a letter, please call our office to schedule the follow-up appointment.  

## 2013-09-01 NOTE — Assessment & Plan Note (Signed)
Blood pressure controlled. Continue present medications. 

## 2013-09-01 NOTE — Assessment & Plan Note (Signed)
Occasional chest pain with stress but no exertional symptoms. She exercises routinely with no symptoms. Her electrocardiogram is very abnormal but unchanged compared to previous. Previous catheterizations shows normal coronary arteries. No further evaluation at this time.

## 2014-01-06 ENCOUNTER — Other Ambulatory Visit (HOSPITAL_COMMUNITY)
Admission: RE | Admit: 2014-01-06 | Discharge: 2014-01-06 | Disposition: A | Discharge status: Home / Self Care | Payer: BC Managed Care – PPO | Source: Ambulatory Visit | Attending: Family Medicine | Admitting: Family Medicine

## 2014-01-06 ENCOUNTER — Other Ambulatory Visit: Payer: Self-pay | Admitting: Family Medicine

## 2014-01-06 DIAGNOSIS — Z124 Encounter for screening for malignant neoplasm of cervix: Secondary | ICD-10-CM | POA: Insufficient documentation

## 2014-01-06 DIAGNOSIS — Z1151 Encounter for screening for human papillomavirus (HPV): Secondary | ICD-10-CM | POA: Insufficient documentation

## 2014-08-23 ENCOUNTER — Other Ambulatory Visit: Payer: Self-pay

## 2014-08-23 DIAGNOSIS — Z1231 Encounter for screening mammogram for malignant neoplasm of breast: Secondary | ICD-10-CM

## 2014-08-30 ENCOUNTER — Ambulatory Visit
Admission: RE | Admit: 2014-08-30 | Discharge: 2014-08-30 | Disposition: A | Discharge status: Home / Self Care | Payer: BC Managed Care – PPO | Source: Ambulatory Visit

## 2014-08-30 DIAGNOSIS — Z1231 Encounter for screening mammogram for malignant neoplasm of breast: Secondary | ICD-10-CM

## 2014-09-17 ENCOUNTER — Other Ambulatory Visit: Payer: Self-pay | Admitting: Cardiology

## 2014-10-05 ENCOUNTER — Encounter: Payer: Self-pay | Admitting: Cardiology

## 2014-10-05 ENCOUNTER — Encounter: Payer: Self-pay | Admitting: *Deleted

## 2014-10-05 ENCOUNTER — Ambulatory Visit (INDEPENDENT_AMBULATORY_CARE_PROVIDER_SITE_OTHER): Payer: BC Managed Care – PPO | Admitting: Cardiology

## 2014-10-05 VITALS — BP 119/71 | HR 60 | Ht 64.0 in | Wt 141.2 lb

## 2014-10-05 DIAGNOSIS — R9431 Abnormal electrocardiogram [ECG] [EKG]: Secondary | ICD-10-CM

## 2014-10-05 MED ORDER — AMLODIPINE BESYLATE 2.5 MG PO TABS: 2.5000 mg | ORAL_TABLET | Freq: Every day | ORAL | Status: DC

## 2014-10-05 NOTE — Assessment & Plan Note (Signed)
No recent chest pain. Previous catheterization with no obstructive coronary disease.

## 2014-10-05 NOTE — Patient Instructions (Signed)

## 2014-10-05 NOTE — Assessment & Plan Note (Signed)
Electrocardiogram is unchanged. We'll repeat echocardiogram to assess LV function and exclude HOCM.

## 2014-10-05 NOTE — Progress Notes (Signed)
      HPI: followup chest pain. Echocardiogram in December of 2006 showed normal LV function. Cardiac catheterization in December 2006, which showed normal coronaries and good LV function. Stress test 2010 with question of lateral wall ischemia; cath showed normal cors and normal EF. Since last seen, the patient denies any dyspnea on exertion, orthopnea, PND, pedal edema, palpitations, syncope or chest pain.    Current Outpatient Prescriptions  Medication Sig Dispense Refill  . amLODipine (NORVASC) 2.5 MG tablet take 1 tablet by mouth once daily  30 tablet  0  . aspirin 81 MG EC tablet Take 81 mg by mouth daily.        . Calcium Carbonate-Vit D-Min (CALCIUM 1200 PO) Take 1 tablet by mouth daily.        . Cholecalciferol (VITAMIN D) 2000 UNITS CAPS Take by mouth.        . ibandronate (BONIVA) 150 MG tablet Take 150 mg by mouth every 30 (thirty) days. Take in the morning with a full glass of water, on an empty stomach, and do not take anything else by mouth or lie down for the next 30 min.       . nitroGLYCERIN (NITROSTAT) 0.4 MG SL tablet Place 1 tablet (0.4 mg total) under the tongue every 5 (five) minutes as needed.  25 tablet  9  . pantoprazole (PROTONIX) 20 MG tablet Take 20 mg by mouth daily.        . Potassium Gluconate 550 MG TABS Take 1 tablet by mouth daily.        Marland Kitchen. zolpidem (AMBIEN) 5 MG tablet Take 5 mg by mouth at bedtime.        No current facility-administered medications for this visit.     Past Medical History  Diagnosis Date  . Chest pain     with abnormal EKG. cardiac cath 12/06- normal coronaries, normal EF. cath 10/10- normal coronaries and EF  . HTN (hypertension)   . Kidney stones     Past Surgical History  Procedure Laterality Date  . 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 Marital Status: Married    Spouse Name: N/A    Number of Children: N/A  . Years of Education: N/A   Occupational History  . Not on file.     Social History Main Topics  . Smoking status: Never Smoker   . Smokeless tobacco: Never Used  . Alcohol Use: No  . Drug Use: No  . Sexual Activity: Not on file   Other Topics Concern  . Not on file   Social History Narrative   Married, homemaker. Is active in yoga and low-impact aerobics.     ROS: no fevers or chills, productive cough, hemoptysis, dysphasia, odynophagia, melena, hematochezia, dysuria, hematuria, rash, seizure activity, orthopnea, PND, pedal edema, claudication. Remaining systems are negative.  Physical Exam: Well-developed well-nourished in no acute distress.  Skin is warm and dry.  HEENT is normal.  Neck is supple.  Chest is clear to auscultation with normal expansion.  Cardiovascular exam is regular rate and rhythm.  Abdominal exam nontender or distended. No masses palpated. Extremities show no edema. neuro grossly intact  ECG Sinus rhythm at a rate of 60. First degree AV block. Left ventricular hypertrophy with repolarization abnormality. Cannot rule out prior septal infarct.

## 2014-10-05 NOTE — Assessment & Plan Note (Signed)
Blood pressure controlled. Continue present medications. 

## 2014-10-11 ENCOUNTER — Ambulatory Visit (HOSPITAL_COMMUNITY)
Admission: RE | Admit: 2014-10-11 | Discharge: 2014-10-11 | Disposition: A | Discharge status: Home / Self Care | Payer: BC Managed Care – PPO | Source: Ambulatory Visit | Attending: Cardiology | Admitting: Cardiology

## 2014-10-11 DIAGNOSIS — R9431 Abnormal electrocardiogram [ECG] [EKG]: Secondary | ICD-10-CM | POA: Diagnosis present

## 2014-10-11 DIAGNOSIS — R079 Chest pain, unspecified: Secondary | ICD-10-CM | POA: Diagnosis not present

## 2014-10-11 DIAGNOSIS — I10 Essential (primary) hypertension: Secondary | ICD-10-CM | POA: Insufficient documentation

## 2014-10-11 NOTE — Progress Notes (Signed)
2D Echo Performed 10/11/2014    Deondray Ospina, RCS  

## 2015-03-28 ENCOUNTER — Other Ambulatory Visit (HOSPITAL_COMMUNITY)
Admission: RE | Admit: 2015-03-28 | Discharge: 2015-03-28 | Disposition: A | Discharge status: Home / Self Care | Payer: BLUE CROSS/BLUE SHIELD | Source: Ambulatory Visit | Attending: Family Medicine | Admitting: Family Medicine

## 2015-03-28 ENCOUNTER — Other Ambulatory Visit: Payer: Self-pay | Admitting: Family Medicine

## 2015-03-28 DIAGNOSIS — Z01419 Encounter for gynecological examination (general) (routine) without abnormal findings: Secondary | ICD-10-CM | POA: Diagnosis not present

## 2015-03-28 DIAGNOSIS — Z1151 Encounter for screening for human papillomavirus (HPV): Secondary | ICD-10-CM | POA: Diagnosis present

## 2015-03-30 LAB — CYTOLOGY - PAP

## 2015-09-27 ENCOUNTER — Other Ambulatory Visit: Payer: Self-pay

## 2015-09-27 DIAGNOSIS — Z1231 Encounter for screening mammogram for malignant neoplasm of breast: Secondary | ICD-10-CM

## 2015-10-04 ENCOUNTER — Ambulatory Visit
Admission: RE | Admit: 2015-10-04 | Discharge: 2015-10-04 | Disposition: A | Discharge status: Home / Self Care | Payer: BLUE CROSS/BLUE SHIELD | Source: Ambulatory Visit

## 2015-10-04 DIAGNOSIS — Z1231 Encounter for screening mammogram for malignant neoplasm of breast: Secondary | ICD-10-CM

## 2015-10-30 NOTE — Progress Notes (Signed)
      HPI: followup chest pain. Echocardiogram in December of 2006 showed normal LV function. Cardiac catheterization in December 2006, which showed normal coronaries and good LV function. Stress test 2010 with question of lateral wall ischemia; cath showed normal cors and normal EF. Echocardiogram repeated October 2015 and showed normal LV function. Since last seen, the patient denies any dyspnea on exertion, orthopnea, PND, pedal edema, palpitations, syncope or chest pain.   Current Outpatient Prescriptions  Medication Sig Dispense Refill  . amLODipine (NORVASC) 2.5 MG tablet Take 1 tablet (2.5 mg total) by mouth daily. 90 tablet 3  . aspirin 81 MG EC tablet Take 81 mg by mouth daily.      . Calcium Carbonate-Vit D-Min (CALCIUM 1200 PO) Take 1 tablet by mouth daily.      . Cholecalciferol (VITAMIN D) 2000 UNITS CAPS Take by mouth.      . ibandronate (BONIVA) 150 MG tablet Take 150 mg by mouth every 30 (thirty) days. Take in the morning with a full glass of water, on an empty stomach, and do not take anything else by mouth or lie down for the next 30 min.     . nitroGLYCERIN (NITROSTAT) 0.4 MG SL tablet Place 1 tablet (0.4 mg total) under the tongue every 5 (five) minutes as needed. 25 tablet 9  . pantoprazole (PROTONIX) 20 MG tablet Take 20 mg by mouth daily.      . Potassium Gluconate 550 MG TABS Take 1 tablet by mouth daily.      . valACYclovir (VALTREX) 1000 MG tablet Take 2 tablets by mouth 2 (two) times daily as needed.  0  . zolpidem (AMBIEN) 5 MG tablet Take 5 mg by mouth at bedtime.      No current facility-administered medications for this visit.     Past Medical History  Diagnosis Date  . Chest pain     with abnormal EKG. cardiac cath 12/06- normal coronaries, normal EF. cath 10/10- normal coronaries and EF  . HTN (hypertension)   . Kidney stones     Past Surgical History  Procedure Laterality Date  . 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 Marital Status: Married    Spouse Name: N/A  . Number of Children: N/A  . Years of Education: N/A   Occupational History  . Not on file.   Social History Main Topics  . Smoking status: Never Smoker   . Smokeless tobacco: Never Used  . Alcohol Use: No  . Drug Use: No  . Sexual Activity: Not on file   Other Topics Concern  . Not on file   Social History Narrative   Married, homemaker. Is active in yoga and low-impact aerobics.     ROS: no fevers or chills, productive cough, hemoptysis, dysphasia, odynophagia, melena, hematochezia, dysuria, hematuria, rash, seizure activity, orthopnea, PND, pedal edema, claudication. Remaining systems are negative.  Physical Exam: Well-developed well-nourished in no acute distress.  Skin is warm and dry.  HEENT is normal.  Neck is supple.  Chest is clear to auscultation with normal expansion.  Cardiovascular exam is regular rate and rhythm.  Abdominal exam nontender or distended. No masses palpated. Extremities show no edema. neuro grossly intact  ECG Sinus rhythm at a rate of 66. First-degree AV block. Septal infarct. Marked diffuse ST depression. ST changes are not different compared to 10/02/2014.

## 2015-11-02 ENCOUNTER — Encounter: Payer: Self-pay | Admitting: Cardiology

## 2015-11-02 ENCOUNTER — Ambulatory Visit (INDEPENDENT_AMBULATORY_CARE_PROVIDER_SITE_OTHER): Payer: BLUE CROSS/BLUE SHIELD | Admitting: Cardiology

## 2015-11-02 VITALS — BP 130/60 | HR 66 | Ht 64.0 in | Wt 149.1 lb

## 2015-11-02 DIAGNOSIS — I10 Essential (primary) hypertension: Secondary | ICD-10-CM | POA: Diagnosis not present

## 2015-11-02 DIAGNOSIS — R9431 Abnormal electrocardiogram [ECG] [EKG]: Secondary | ICD-10-CM

## 2015-11-02 DIAGNOSIS — R072 Precordial pain: Secondary | ICD-10-CM | POA: Diagnosis not present

## 2015-11-02 NOTE — Patient Instructions (Signed)
Your physician wants you to follow-up in: ONE YEAR WITH DR CRENSHAW You will receive a reminder letter in the mail two months in advance. If you don't receive a letter, please call our office to schedule the follow-up appointment.  

## 2015-11-02 NOTE — Assessment & Plan Note (Signed)
Blood pressure controlled. Continue present medications. 

## 2015-11-02 NOTE — Assessment & Plan Note (Signed)
No recurrent chest pain. Previous catheterization showed no obstructive coronary disease.

## 2015-11-02 NOTE — Assessment & Plan Note (Signed)
ECG is markedly abnormal but unchanged compared to previous. Previous cardiac evaluation negative. Patient has a copy of her electrocardiogram to carry with her for comparison if she has outside EKGs done in the future.

## 2015-11-07 ENCOUNTER — Other Ambulatory Visit: Payer: Self-pay | Admitting: Cardiology

## 2016-11-16 ENCOUNTER — Other Ambulatory Visit: Payer: Self-pay | Admitting: Cardiology

## 2017-03-19 ENCOUNTER — Other Ambulatory Visit: Payer: Self-pay | Admitting: Family Medicine

## 2017-03-19 DIAGNOSIS — Z1231 Encounter for screening mammogram for malignant neoplasm of breast: Secondary | ICD-10-CM

## 2017-03-20 ENCOUNTER — Encounter: Payer: Self-pay | Admitting: Radiology

## 2017-03-20 ENCOUNTER — Ambulatory Visit
Admission: RE | Admit: 2017-03-20 | Discharge: 2017-03-20 | Disposition: A | Discharge status: Home / Self Care | Payer: BLUE CROSS/BLUE SHIELD | Source: Ambulatory Visit | Attending: Family Medicine | Admitting: Family Medicine

## 2017-03-20 DIAGNOSIS — Z1231 Encounter for screening mammogram for malignant neoplasm of breast: Secondary | ICD-10-CM

## 2017-03-27 NOTE — Progress Notes (Signed)
Cardiology Office Note    Date:  03/30/2017   ID:  DER [name], DOB 08/30/1951, MRN [id number]  PCP:  Gaye Alken, MD  Cardiologist: Dr. Jens Som  Chief Complaint  Patient presents with  . Follow-up    Routine 18 month follow-up    History of Present Illness:    Jeanne Wilson is a 66 y.o. female with past medical history of normal cors by cath in 2010 (performed in the setting of chest pain and abnormal EKG) and HTN who presents to the office today for her annual appointment.   Was last seen by Dr. Jens Som in 10/2015 and reported doing well from a cardiac perspective at that time. She denied any recent chest pain, dyspnea with exertion, palpitations, or syncope. She was continued on ASA  daily and Amlodipine 2.5mg  daily.   In talking with the patient today, she reports doing well since her last office visit. She denies any recent chest discomfort, palpitations, lightheadedness, dizziness, presyncope, orthopnea, PND, or lower extremity edema. She is very active at baseline and reports going to the gym several times per week and denies any exertional symptoms.  She reports around Christmas she did take one SL NTG due to having a mild aching sensation along her chest. Reports she was very anxious at that time as 25 family members were visiting at her home. Her symptoms did improve with the NTG and she denies having to use this since.   Past Medical History:  Diagnosis Date  . Chest pain    with abnormal EKG. cardiac cath 12/06- normal coronaries, normal EF. cath 10/10- normal coronaries and EF  . HTN (hypertension)   . Kidney stones     Past Surgical History:  Procedure Laterality Date  . laprascopic surgery  2000   for kinked ureter    Current Medications: Outpatient Medications Prior to Visit  Medication Sig Dispense Refill  . aspirin 81 MG EC tablet Take 81 mg by mouth daily.      . Cholecalciferol (VITAMIN D) 2000 UNITS CAPS Take 1 capsule by mouth  daily.     Marland Kitchen ibandronate (BONIVA) 150 MG tablet Take 150 mg by mouth every 30 (thirty) days. Take in the morning with a full glass of water, on an empty stomach, and do not take anything else by mouth or lie down for the next 30 min.     . pantoprazole (PROTONIX) 20 MG tablet Take 20 mg by mouth daily.      . valACYclovir (VALTREX) 1000 MG tablet Take 2 tablets by mouth 2 (two) times daily as needed.  0  . zolpidem (AMBIEN) 5 MG tablet Take 5 mg by mouth at bedtime.     Marland Kitchen amLODipine (NORVASC) 2.5 MG tablet take 1 tablet by mouth once daily 90 tablet 3  . nitroGLYCERIN (NITROSTAT) 0.4 MG SL tablet Place 1 tablet (0.4 mg total) under the tongue every 5 (five) minutes as needed. 25 tablet 9  . Calcium Carbonate-Vit D-Min (CALCIUM 1200 PO) Take 1 tablet by mouth daily.      . Potassium Gluconate 550 MG TABS Take 1 tablet by mouth daily.       No facility-administered medications prior to visit.      Allergies:   Codeine   Social History   Social History  . Marital status: Married    Spouse name: N/A  . Number of children: N/A  . Years of education: N/A   Social History Main Topics  .  Smoking status: Never Smoker  . Smokeless tobacco: Never Used  . Alcohol use No  . Drug use: No  . Sexual activity: Not Asked   Other Topics Concern  . None   Social History Narrative   Married, homemaker. Is active in yoga and low-impact aerobics.      Family History:  The patient's family history includes Hypertension in her mother.   Review of Systems:   Please see the history of present illness.     General:  No chills, fever, night sweats or weight changes.  Cardiovascular:  No chest pain, dyspnea on exertion, edema, orthopnea, palpitations, paroxysmal nocturnal dyspnea. Dermatological: No rash, lesions/masses Respiratory: No cough, dyspnea Urologic: No hematuria, dysuria Abdominal:   No nausea, vomiting, diarrhea, bright red blood per rectum, melena, or hematemesis Neurologic:  No visual  changes, wkns, changes in mental status.  Patient denies any of the above symptoms.   All other systems reviewed and are otherwise negative except as noted above.   Physical Exam:    VS:  BP 120/74   Pulse (!) 59   Ht  (1.626 m)   Wt 146 lb 6.4 oz (66.4 kg)   BMI 25.13 kg/m    General: Well developed, well nourished Caucasian female appearing in no acute distress. Head: Normocephalic, atraumatic, sclera non-icteric, no xanthomas, nares are without discharge.  Neck: No carotid bruits. JVD not elevated.  Lungs: Respirations regular and unlabored, without wheezes or rales.  Heart: Regular rate and rhythm. No S3 or S4.  No murmur, no rubs, or gallops appreciated. Abdomen: Soft, non-tender, non-distended with normoactive bowel sounds. No hepatomegaly. No rebound/guarding. No obvious abdominal masses. Msk:  Strength and tone appear normal for age. No joint deformities or effusions. Extremities: No clubbing or cyanosis. No lower extremityedema.  Distal pedal pulses are 2+ bilaterally. Neuro: Alert and oriented X 3. Moves all extremities spontaneously. No focal deficits noted. Psych:  Responds to questions appropriately with a normal affect. Skin: No rashes or lesions noted  Wt Readings from Last 3 Encounters:  03/30/17 146 lb 6.4 oz (66.4 kg)  11/02/15 149 lb 1.6 oz (67.6 kg)  10/05/14 141 lb 3.2 oz (64 kg)      Studies/Labs Reviewed:   EKG:  EKG is ordered today. The EKG ordered today demonstrates sinus bradycardia, HR 59, ST depression along inferior and lateral leads (similar to prior tracings).   Recent Labs: No results found for requested labs within last 8760 hours.   Lipid Panel No results found for: CHOL, TRIG, HDL, CHOLHDL, VLDL, LDLCALC, LDLDIRECT  Additional studies/ records that were reviewed today include:   Echocardiogram: 09/2014 Study Conclusions  - Left ventricle: The cavity size was normal. Wall thickness was normal. Systolic function was normal.  The estimated ejection fraction was in the range of 60% to 65%. Wall motion was normal; there were no regional wall motion abnormalities.  Cardiac Catheterization: 09/2009   Assessment:    1. Abnormal EKG   2. Essential hypertension      Plan:   In order of problems listed above:  1. Abnormal EKG - the patient has a known abnormal EKG showing diffuse ST depression. She has underwent two cardiac catheterizations (in 2006 and 2010) which both showed normal coronary arteries.  - she denies any recent chest discomfort or dyspnea on exertion. Is active at baseline, denying any anginal symptoms.  - EKG today continues to show diffuse ST depression. She was provided a copy of her EKG to carry with  her. Provided with new Rx for SL NTG. Continue Amlodipine and  ASA.   2. Essential HTN - BP well-controlled at 120/74 during today's visit.  - continue Amlodipine 2.5mg  daily.    Medication Adjustments/Labs and Tests Ordered: Current medicines are reviewed at length with the patient today.  Concerns regarding medicines are outlined above.  Medication changes, Labs and Tests ordered today are listed in the Patient Instructions below. Patient Instructions  Medication Instructions: Your physician recommends that you continue on your current medications as directed. Please refer to the Current Medication list given to you today.   Follow-Up: Your physician wants you to follow-up in: 18 months with Dr. Jens Som. You will receive a reminder letter in the mail two months in advance. If you don't receive a letter, please call our office to schedule the follow-up appointment.    Signed, Ellsworth Lennox, PA  03/30/2017 1:53 PM    [facility] [address] [place], [address] [place], [place]  [address] Phone: [phone]; Fax: [phone]  [address], [address] [place], [place] [address] Phone: [phone]

## 2017-03-30 ENCOUNTER — Ambulatory Visit (INDEPENDENT_AMBULATORY_CARE_PROVIDER_SITE_OTHER): Payer: BLUE CROSS/BLUE SHIELD | Admitting: Student

## 2017-03-30 ENCOUNTER — Encounter: Payer: Self-pay | Admitting: Student

## 2017-03-30 VITALS — BP 120/74 | HR 59 | Ht 64.0 in | Wt 146.4 lb

## 2017-03-30 DIAGNOSIS — I10 Essential (primary) hypertension: Secondary | ICD-10-CM

## 2017-03-30 DIAGNOSIS — R9431 Abnormal electrocardiogram [ECG] [EKG]: Secondary | ICD-10-CM | POA: Diagnosis not present

## 2017-03-30 MED ORDER — AMLODIPINE BESYLATE 2.5 MG PO TABS: 2.5000 mg | ORAL_TABLET | Freq: Every day | ORAL | 3 refills | Status: DC

## 2017-03-30 MED ORDER — NITROGLYCERIN 0.4 MG SL SUBL: 0.4000 mg | SUBLINGUAL_TABLET | SUBLINGUAL | 2 refills | Status: DC | PRN

## 2017-03-30 NOTE — Patient Instructions (Signed)
Medication Instructions: Your physician recommends that you continue on your current medications as directed. Please refer to the Current Medication list given to you today.   Follow-Up: Your physician wants you to follow-up in: 18 months with Dr. Jens Som. You will receive a reminder letter in the mail two months in advance. If you don't receive a letter, please call our office to schedule the follow-up appointment.

## 2018-01-24 ENCOUNTER — Emergency Department (HOSPITAL_COMMUNITY)
Admission: EM | Admit: 2018-01-24 | Discharge: 2018-01-25 | Disposition: A | Discharge status: Home / Self Care | Payer: BLUE CROSS/BLUE SHIELD | Attending: Emergency Medicine | Admitting: Emergency Medicine

## 2018-01-24 ENCOUNTER — Encounter (HOSPITAL_COMMUNITY): Payer: Self-pay

## 2018-01-24 ENCOUNTER — Other Ambulatory Visit: Payer: Self-pay

## 2018-01-24 ENCOUNTER — Emergency Department (HOSPITAL_COMMUNITY): Payer: BLUE CROSS/BLUE SHIELD

## 2018-01-24 DIAGNOSIS — R079 Chest pain, unspecified: Secondary | ICD-10-CM

## 2018-01-24 DIAGNOSIS — Z79899 Other long term (current) drug therapy: Secondary | ICD-10-CM | POA: Insufficient documentation

## 2018-01-24 DIAGNOSIS — R0602 Shortness of breath: Secondary | ICD-10-CM | POA: Diagnosis not present

## 2018-01-24 DIAGNOSIS — R05 Cough: Secondary | ICD-10-CM | POA: Insufficient documentation

## 2018-01-24 DIAGNOSIS — Z7982 Long term (current) use of aspirin: Secondary | ICD-10-CM | POA: Insufficient documentation

## 2018-01-24 DIAGNOSIS — R112 Nausea with vomiting, unspecified: Secondary | ICD-10-CM | POA: Insufficient documentation

## 2018-01-24 DIAGNOSIS — R61 Generalized hyperhidrosis: Secondary | ICD-10-CM | POA: Insufficient documentation

## 2018-01-24 DIAGNOSIS — I10 Essential (primary) hypertension: Secondary | ICD-10-CM | POA: Diagnosis not present

## 2018-01-24 HISTORY — DX: Calculus of kidney: N20.0

## 2018-01-24 LAB — BASIC METABOLIC PANEL
Anion gap: 12 (ref 5–15)
BUN: 12 mg/dL (ref 6–20)
CALCIUM: 9.6 mg/dL (ref 8.9–10.3)
CO2: 21 mmol/L — AB (ref 22–32)
CREATININE: 0.71 mg/dL (ref 0.44–1.00)
Chloride: 105 mmol/L (ref 101–111)
GFR calc non Af Amer: >60 mL/min (ref >60)
Glucose, Bld: 104 mg/dL — ABNORMAL HIGH (ref 65–99)
Potassium: 4.2 mmol/L (ref 3.5–5.1)
SODIUM: 138 mmol/L (ref 135–145)

## 2018-01-24 LAB — CBC
HCT: 42.4 % (ref 36.0–46.0)
Hemoglobin: 13.9 g/dL (ref 12.0–15.0)
MCH: 32.6 pg (ref 26.0–34.0)
MCHC: 32.8 g/dL (ref 30.0–36.0)
MCV: 99.3 fL (ref 78.0–100.0)
PLATELETS: 315 10*3/uL (ref 150–400)
RBC: 4.27 MIL/uL (ref 3.87–5.11)
RDW: 12.7 % (ref 11.5–15.5)
WBC: 9 10*3/uL (ref 4.0–10.5)

## 2018-01-24 LAB — HEPATIC FUNCTION PANEL
ALK PHOS: 76 U/L (ref 38–126)
ALT: 19 U/L (ref 14–54)
AST: 25 U/L (ref 15–41)
Albumin: 4.3 g/dL (ref 3.5–5.0)
BILIRUBIN DIRECT: 0.2 mg/dL (ref 0.1–0.5)
BILIRUBIN TOTAL: 0.8 mg/dL (ref 0.3–1.2)
Indirect Bilirubin: 0.6 mg/dL (ref 0.3–0.9)
Total Protein: 7.5 g/dL (ref 6.5–8.1)

## 2018-01-24 LAB — I-STAT TROPONIN, ED: TROPONIN I, POC: 0.01 ng/mL (ref 0.00–0.08)

## 2018-01-24 LAB — D-DIMER, QUANTITATIVE: D-Dimer, Quant: <0.27 ug/mL-FEU (ref 0.00–0.50)

## 2018-01-24 LAB — PROTIME-INR
INR: 0.95
Prothrombin Time: 12.6 seconds (ref 11.4–15.2)

## 2018-01-24 LAB — LIPASE, BLOOD: Lipase: 26 U/L (ref 11–51)

## 2018-01-24 MED ORDER — MORPHINE SULFATE (PF) 4 MG/ML IV SOLN
4.0000 mg | Freq: Once | INTRAVENOUS | Status: AC
  Administered 2018-01-24: 4 mg via INTRAVENOUS
  Filled 2018-01-24: qty 1

## 2018-01-24 NOTE — ED Notes (Signed)
Pt ambulated to bathroom with steady gait. 

## 2018-01-24 NOTE — ED Provider Notes (Signed)
[facility] Provider Note   CSN: [id number] Arrival date & time: 01/24/18  1937     History   Chief Complaint Chief Complaint  Patient presents with  . Chest Pain    HPI Jeanne Wilson is a 67 y.o. female.  The history is provided by medical records, the patient and the spouse. No language interpreter was used.  Chest Pain   This is a recurrent problem. The current episode started 12 to 24 hours ago. The problem occurs constantly. The problem has not changed since onset.The pain is present in the substernal region and lateral region. The pain is at a severity of 8/10. The pain is moderate. The quality of the pain is described as exertional and pressure-like. The pain radiates to the left arm and right arm. Associated symptoms include cough, diaphoresis, nausea, shortness of breath and vomiting. Pertinent negatives include no abdominal pain, no back pain, no dizziness, no exertional chest pressure, no fever, no headaches, no irregular heartbeat, no lower extremity edema, no palpitations, no sputum production and no syncope. She has tried nothing for the symptoms. The treatment provided no relief.  Pertinent negatives for past medical history include no seizures.    Past Medical History:  Diagnosis Date  . Chest pain    with abnormal EKG. cardiac cath 12/06- normal coronaries, normal EF. cath 10/10- normal coronaries and EF  . HTN (hypertension)   . Kidney stone   . Kidney stones     Patient Active Problem List   Diagnosis Date Noted  . Abnormal electrocardiogram 10/05/2014  . HYPERTENSION, BENIGN 06/01/2009  . CHEST PAIN-PRECORDIAL 05/14/2009    Past Surgical History:  Procedure Laterality Date  . LAPAROSCOPY    . 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 Authorizing Provider    amLODipine (NORVASC) 2.5 MG tablet Take 1 tablet (2.5 mg total) by mouth daily. 03/30/17   Strader, Lennart PallBrittany M, PA-C  aspirin 81 MG EC tablet Take 81 mg by mouth daily.      [provider]  CALCIUM-MAGNESIUM-ZINC PO Take 1,500 capsules by mouth daily.    [provider]  Cholecalciferol (VITAMIN D) 2000 UNITS CAPS Take 1 capsule by mouth daily.     [provider]  ibandronate (BONIVA) 150 MG tablet Take 150 mg by mouth every 30 (thirty) days. Take in the morning with a full glass of water, on an empty stomach, and do not take anything else by mouth or lie down for the next 30 min.     [provider]  nitroGLYCERIN (NITROSTAT) 0.4 MG SL tablet Place 1 tablet (0.4 mg total) under the tongue every 5 (five) minutes as needed. 03/30/17   Strader, Lennart PallBrittany M, PA-C  pantoprazole (PROTONIX) 20 MG tablet Take 20 mg by mouth daily.      [provider]  valACYclovir (VALTREX) 1000 MG tablet Take 2 tablets by mouth 2 (two) times daily as needed. 10/04/15   [provider]  zolpidem (AMBIEN) 5 MG tablet Take 5 mg by mouth at bedtime.  09/06/14   [provider]    Family History Family History  Problem Relation Age of Onset  . Hypertension Mother     Social History Social History   Tobacco Use  . Smoking  status: Never Smoker  . Smokeless tobacco: Never Used  Substance Use Topics  . Alcohol use: No  . Drug use: No     Allergies   Codeine   Review of Systems Review of Systems  Constitutional: Positive for diaphoresis and fatigue. Negative for activity change, appetite change, chills and fever.  HENT: Positive for congestion and rhinorrhea.   Eyes: Negative for visual disturbance.  Respiratory: Positive for cough, chest tightness and shortness of breath. Negative for sputum production, choking, wheezing and stridor.   Cardiovascular: Positive for chest pain. Negative for palpitations, leg swelling and syncope.  Gastrointestinal:  Positive for nausea and vomiting. Negative for abdominal pain, constipation and diarrhea.  Genitourinary: Negative for dysuria.  Musculoskeletal: Negative for back pain, gait problem and neck pain.  Skin: Negative for rash.  Neurological: Positive for light-headedness. Negative for dizziness, seizures, syncope, speech difficulty and headaches.  Psychiatric/Behavioral: Negative for agitation.  All other systems reviewed and are negative.    Physical Exam Updated Vital Signs BP (!) 185/82 (BP Location: Right Arm)   Pulse 85   Temp 98 F (36.7 C) (Oral)   Resp 16   Ht 5\' 4"  (1.626 m)   Wt 66.2 kg (146 lb)   SpO2 96%   BMI 25.06 kg/m   Physical Exam  Constitutional: She is oriented to person, place, and time. She appears well-developed and well-nourished.  Non-toxic appearance. She does not appear ill. No distress.  HENT:  Head: Normocephalic and atraumatic.  Eyes: Conjunctivae and EOM are normal. Pupils are equal, round, and reactive to light.  Neck: Normal range of motion.  Cardiovascular: Normal rate and intact distal pulses.  No murmur heard. Pulmonary/Chest: Effort normal and breath sounds normal. No stridor. No respiratory distress. She has no wheezes. She has no rales. She exhibits no tenderness.  Abdominal: Soft. Bowel sounds are normal. She exhibits no distension. There is no tenderness.  Musculoskeletal: Normal range of motion. She exhibits no edema.  Neurological: She is alert and oriented to person, place, and time. No sensory deficit. She exhibits normal muscle tone.  Skin: Skin is warm. Capillary refill takes less than 2 seconds. No rash noted. She is not diaphoretic. No erythema.  Nursing note and vitals reviewed.    ED Treatments / Results  Labs (all labs ordered are listed, but only abnormal results are displayed) Labs Reviewed  BASIC METABOLIC PANEL - Abnormal; Notable for the following components:      Result Value   CO2 21 (*)    Glucose, Bld 104 (*)     All other components within normal limits  CBC  PROTIME-INR  D-DIMER, QUANTITATIVE (NOT AT [facility])  HEPATIC FUNCTION PANEL  LIPASE, BLOOD  I-STAT TROPONIN, ED  I-STAT TROPONIN, ED    EKG  EKG Interpretation  Date/Time:  Sunday January 24 2018 19:38:50 EST Ventricular Rate:  84 PR Interval:  186 QRS Duration: 82 QT Interval:  386 QTC Calculation: 456 R Axis:   17 Text Interpretation:  Normal sinus rhythm Marked ST abnormality, possible inferior subendocardial injury Abnormal ECG No significant change since last tracing Confirmed by Drema Pry [phone]) on 01/24/2018 7:45:28 PM       Radiology Dg Chest 2 View  Result Date: 01/24/2018 CLINICAL DATA:  Chest pain EXAM: CHEST  2 VIEW COMPARISON:  01/12/2017 FINDINGS: Normal heart size and mediastinal contours. Chronic linear opacity the right middle lobe consistent with scarring. There is no edema, consolidation, effusion, or pneumothorax. Symmetric and fairly smooth biapical pleural thickening.  IMPRESSION: 1. No acute finding. 2. Stable right middle lobe scarring. Electronically Signed   By: Marnee Spring M.D.   On: 01/24/2018 20:32    Procedures Procedures (including critical care time)  Medications Ordered in ED Medications  morphine 4 MG/ML injection 4 mg (4 mg Intravenous Given 01/24/18 2149)     Initial Impression / Assessment and Plan / ED Course  I have reviewed the triage vital signs and the nursing notes.  Pertinent labs & imaging results that were available during my care of the patient were reviewed by me and considered in my medical decision making (see chart for details).     Jeanne Wilson is a 67 y.o. female with a past medical history significant for hypertension, kidney stones, and prior chest pain who presents with chest pain.  Patient reports that 2 weeks ago, patient had a URI with congestion and cold.  Patient says that she developed nausea and vomiting several days ago that has continued.  She reports  that yesterday she continued to have the congestion and coughing and was started on levofloxacin.  Patient reports she still on the medication.  She says that today however, she developed chest pain.  She describes it as a chest pressure and tightness of "someone sitting on me".  She reports that it radiates down her arms.  She reports associated nausea as well as diaphoresis.  She also reports some lightheadedness.  She says that this feels similar to chest pain she has had in the past that has required extensive workup.  Of note, patient has a history of abnormal EKG.  Patient reports that she took nitroglycerin today which did not help her symptoms and she also took aspirin.  She reports continued nausea and vomiting.  She denies any conservation, diarrhea, or dysuria.  She denies any recent trauma.  She describes no exertional component and no pleuritic component of the pain.  She describes as a 6 out of 10 in severity on arrival.  EKG was performed in triage with concerning ST depressions and some elevations.  It does however look similar in morphology to prior EKGs.  Chart review shows the patient is followed by cardiology at this facility and she has had reassuring heart catheterization in the past.  Patient had laboratory testing showing negative d-dimer, initially negative troponin, CBC and BMP were reassuring.  Patient had a lipase and hepatic function added given the proximity of the upper abdomen to the lower chest.  Lipase and hepatic function panel reassuring.  Patient will be also be given morphine to try to alleviate her discomfort.  Cardiology will be called for recommendations given the patient's concerning chest pain and abnormal EKG.  11:23 PM Patient reports her chest pain completely resolved after morphine.  Patient feels normal and has no further chest pain.  Anticipate discharge if cardiology feels this is safe for patient with a prescription of pain medicine and cardiology  follow-up.  Cardiology requested delta troponin but agreed that if it was negative, she would likely be safe for discharge home.  Second troponin will be collected.  If patient has reassuring troponin, patient will be discharged with prescription for pain medication and cardiology follow-up.  12:32 AM Delta troponin was negative.  Patient would like to be discharged home.    Patient given prescription for Percocet for the pain and will follow up with cardiology.  Patient understood return precautions and plan of care.  Patient discharged in good condition with resolution of all  symptoms.   Final Clinical Impressions(s) / ED Diagnoses   Final diagnoses:  Nonspecific chest pain    ED Discharge Orders        Ordered    oxyCODONE-acetaminophen (PERCOCET/ROXICET) 5-325 MG tablet  Every 4 hours PRN     01/25/18 0030      Clinical Impression: 1. Nonspecific chest pain     Disposition: Discharge  Condition: Good  I have discussed the results, Dx and Tx plan with the pt(& family if present). He/she/they expressed understanding and agree(s) with the plan. Discharge instructions discussed at great length. Strict return precautions discussed and pt &/or family have verbalized understanding of the instructions. No further questions at time of discharge.    New Prescriptions   OXYCODONE-ACETAMINOPHEN (PERCOCET/ROXICET) 5-325 MG TABLET    Take 1 tablet by mouth every 4 (four) hours as needed for severe pain.    Follow Up: Juluis Rainier, MD [address] [place] [address] [phone]     [facility] EMERGENCY DEPARTMENT [address] [id number] mc [place] [address] [phone]       Rochele Lueck, Canary Brim, MD 01/25/18 [phone]

## 2018-01-24 NOTE — ED Triage Notes (Signed)
Pt c/o right sided chest tightness and aching radiating down arm and up neck.  Pt brought EKG's states they are always irregular.

## 2018-01-24 NOTE — Progress Notes (Signed)
Called to discuss  abnl  EKG patietn  Has  H/o Abnl EKG- Diffuse ST  Depressions, inferior  And  Precordial  subt  avr  St  Elevation  As  Well,  Although  pts CP is relieved  And  Most  Of  ehr EKG  Changes  Are  Similar  To her  Prior  KEG's  And  Her clinical cardiology  Notes  Also  Documented  Multiple  Similar abnl  ekg's  And  Normal cors  In  A  Couple  Of  Cath  Reports  In  Last  5-10 years.  We  Advised   To get Troponin x2   In ER  And inforemd  ER . CALL CARDIOLOGY  IF  FURTHER  ABL  SYMPTOSM OR  CP OR ABNL  LABS

## 2018-01-25 LAB — I-STAT TROPONIN, ED: Troponin i, poc: 0.02 ng/mL (ref 0.00–0.08)

## 2018-01-25 MED ORDER — OXYCODONE-ACETAMINOPHEN 5-325 MG PO TABS: 1.0000 | ORAL_TABLET | ORAL | 0 refills | Status: DC | PRN

## 2018-01-25 NOTE — Discharge Instructions (Signed)
Your workup today did not show convincing evidence of a cardiac cause of your symptoms despite your unchanged abnormal EKG.  Your cardiac enzymes were negative both times we checked him and after speaking with cardiology, they felt you are safe to go home given your improvement in symptoms.  Please use the pain medicine if you begin having symptoms again please follow-up with your cardiologist in the next few days.  If any symptoms return, change, or worsen, please return to the nearest emergency department.  Please stay hydrated and do not hesitate to return if things worsen.

## 2018-05-16 ENCOUNTER — Other Ambulatory Visit: Payer: Self-pay | Admitting: Student

## 2018-05-17 NOTE — Telephone Encounter (Signed)
Rx has been sent to the pharmacy electronically. ° °

## 2018-06-07 DIAGNOSIS — E559 Vitamin D deficiency, unspecified: Secondary | ICD-10-CM | POA: Diagnosis not present

## 2018-06-07 DIAGNOSIS — I341 Nonrheumatic mitral (valve) prolapse: Secondary | ICD-10-CM | POA: Diagnosis not present

## 2018-06-07 DIAGNOSIS — M810 Age-related osteoporosis without current pathological fracture: Secondary | ICD-10-CM | POA: Diagnosis not present

## 2018-06-07 DIAGNOSIS — I10 Essential (primary) hypertension: Secondary | ICD-10-CM | POA: Diagnosis not present

## 2018-07-12 ENCOUNTER — Other Ambulatory Visit: Payer: Self-pay | Admitting: Family Medicine

## 2018-07-12 DIAGNOSIS — Z1231 Encounter for screening mammogram for malignant neoplasm of breast: Secondary | ICD-10-CM

## 2018-07-14 DIAGNOSIS — N390 Urinary tract infection, site not specified: Secondary | ICD-10-CM | POA: Diagnosis not present

## 2018-07-30 ENCOUNTER — Ambulatory Visit
Admission: RE | Admit: 2018-07-30 | Discharge: 2018-07-30 | Disposition: A | Discharge status: Home / Self Care | Payer: BLUE CROSS/BLUE SHIELD | Source: Ambulatory Visit | Attending: Family Medicine | Admitting: Family Medicine

## 2018-07-30 DIAGNOSIS — Z1231 Encounter for screening mammogram for malignant neoplasm of breast: Secondary | ICD-10-CM | POA: Diagnosis not present

## 2018-09-26 NOTE — Progress Notes (Signed)
HPI: FU chest pain. Echocardiogram in December of 2006 showed normal LV function. Cardiac catheterization in December 2006, which showed normal coronaries and good LV function. Stress test 2010 with question of lateral wall ischemia; cath showed normal cors and normal EF. Echocardiogram repeated October 2015 and showed normal LV function. Since last seen, the patient denies any dyspnea on exertion, orthopnea, PND, pedal edema, palpitations, syncope or chest pain.   Current Outpatient Medications  Medication Sig Dispense Refill  . amLODipine (NORVASC) 2.5 MG tablet TAKE 1 TABLET BY MOUTH ONCE DAILY 90 tablet 0  . CALCIUM-MAGNESIUM-ZINC PO Take 1,500 capsules by mouth daily.    Marland Kitchen ibandronate (BONIVA) 150 MG tablet Take 150 mg by mouth every 30 (thirty) days. Take in the morning with a full glass of water, on an empty stomach, and do not take anything else by mouth or lie down for the next 30 min.     . Loratadine (CLARITIN PO) Take by mouth daily.    . nitroGLYCERIN (NITROSTAT) 0.4 MG SL tablet Place 1 tablet (0.4 mg total) under the tongue every 5 (five) minutes as needed. 25 tablet 2  . pantoprazole (PROTONIX) 20 MG tablet Take 20 mg by mouth daily.      . valACYclovir (VALTREX) 1000 MG tablet Take 1,000 mg by mouth as needed.    . zolpidem (AMBIEN) 5 MG tablet Take 5 mg by mouth at bedtime.      No current facility-administered medications for this visit.      Past Medical History:  Diagnosis Date  . Chest pain    with abnormal EKG. cardiac cath 12/06- normal coronaries, normal EF. cath 10/10- normal coronaries and EF  . HTN (hypertension)   . Kidney stone   . Kidney stones     Past Surgical History:  Procedure Laterality Date  . LAPAROSCOPY    . 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 Marital status: Married    Spouse name: Not on file  . Number of children: Not on file  . Years of education: Not on file  .  Highest education level: Not on file  Occupational History  . Not on file  Social Needs  . Financial resource strain: Not on file  . Food insecurity:    Worry: Not on file    Inability: Not on file  . Transportation needs:    Medical: Not on file    Non-medical: Not on file  Tobacco Use  . Smoking status: Never Smoker  . Smokeless tobacco: Never Used  Substance and Sexual Activity  . Alcohol use: No  . Drug use: No  . Sexual activity: Not on file  Lifestyle  . Physical activity:    Days per week: Not on file    Minutes per session: Not on file  . Stress: Not on file  Relationships  . Social connections:    Talks on phone: Not on file    Gets together: Not on file    Attends religious service: Not on file    Active member of club or organization: Not on file    Attends meetings of clubs or organizations: Not on file    Relationship status: Not on file  . Intimate partner violence:    Fear of current or ex partner: Not on file    Emotionally abused: Not on file    Physically abused: Not on file  Forced sexual activity: Not on file  Other Topics Concern  . Not on file  Social History Narrative   Married, homemaker. Is active in yoga and low-impact aerobics.     Family History  Problem Relation Age of Onset  . Hypertension Mother     ROS: no fevers or chills, productive cough, hemoptysis, dysphasia, odynophagia, melena, hematochezia, dysuria, hematuria, rash, seizure activity, orthopnea, PND, pedal edema, claudication. Remaining systems are negative.  Physical Exam: Well-developed well-nourished in no acute distress.  Skin is warm and dry.  HEENT is normal.  Neck is supple.  Chest is clear to auscultation with normal expansion.  Cardiovascular exam is regular rate and rhythm.  Abdominal exam nontender or distended. No masses palpated. Extremities show no edema. neuro grossly intact  ECG-sinus rhythm with diffuse ST depression, unchanged compared to January 24, 2018.  Personally reviewed  A/P  1 chest pain-previous catheterizations revealed no coronary disease.  2 hypertension-patient's blood pressure is mildly elevated.   3 abnormal electrocardiogram-patient with history of abnormal electrocardiogram and is unchanged compared to previous.  She understands to keep a copy of her tracing with her for comparison.  Olga Millers, MD

## 2018-10-01 ENCOUNTER — Ambulatory Visit: Payer: Medicare Other | Admitting: Cardiology

## 2018-10-01 ENCOUNTER — Encounter: Payer: Self-pay | Admitting: Cardiology

## 2018-10-01 VITALS — BP 140/78 | HR 70 | Ht 64.0 in | Wt 150.2 lb

## 2018-10-01 DIAGNOSIS — R9431 Abnormal electrocardiogram [ECG] [EKG]: Secondary | ICD-10-CM | POA: Diagnosis not present

## 2018-10-01 DIAGNOSIS — I10 Essential (primary) hypertension: Secondary | ICD-10-CM

## 2018-10-01 DIAGNOSIS — R072 Precordial pain: Secondary | ICD-10-CM | POA: Diagnosis not present

## 2018-10-01 NOTE — Patient Instructions (Signed)
Medication Instructions:  NO CHANGE If you need a refill on your cardiac medications before your next appointment, please call your pharmacy.   Lab work: If you have labs (blood work) drawn today and your tests are completely normal, you will receive your results only by: . MyChart Message (if you have MyChart) OR . A paper copy in the mail If you have any lab test that is abnormal or we need to change your treatment, we will call you to review the results.  Follow-Up: At [facility], you and your health needs are our priority.  As part of our continuing mission to provide you with exceptional heart care, we have created designated Provider Care Teams.  These Care Teams include your primary Cardiologist (physician) and Advanced Practice Providers (APPs -  Physician Assistants and Nurse Practitioners) who all work together to provide you with the care you need, when you need it. You will need a follow up appointment in 12 months.  Please call our office 2 months in advance to schedule this appointment.  You may see DR CRENSHAW or one of the following Advanced Practice Providers on your designated Care Team:   Luke Kilroy, PA-C Krista Kroeger, PA-C . Callie Goodrich, PA-C     

## 2018-10-24 DIAGNOSIS — Z23 Encounter for immunization: Secondary | ICD-10-CM | POA: Diagnosis not present

## 2018-12-02 DIAGNOSIS — M810 Age-related osteoporosis without current pathological fracture: Secondary | ICD-10-CM | POA: Diagnosis not present

## 2018-12-02 DIAGNOSIS — E559 Vitamin D deficiency, unspecified: Secondary | ICD-10-CM | POA: Diagnosis not present

## 2018-12-02 DIAGNOSIS — G4700 Insomnia, unspecified: Secondary | ICD-10-CM | POA: Diagnosis not present

## 2018-12-02 DIAGNOSIS — I10 Essential (primary) hypertension: Secondary | ICD-10-CM | POA: Diagnosis not present

## 2018-12-07 ENCOUNTER — Other Ambulatory Visit: Payer: Self-pay | Admitting: Family Medicine

## 2018-12-07 DIAGNOSIS — M810 Age-related osteoporosis without current pathological fracture: Secondary | ICD-10-CM

## 2019-01-10 ENCOUNTER — Other Ambulatory Visit: Payer: Self-pay | Admitting: Cardiology

## 2019-01-10 MED ORDER — AMLODIPINE BESYLATE 2.5 MG PO TABS: 2.5000 mg | ORAL_TABLET | Freq: Every day | ORAL | 3 refills | Status: DC

## 2019-01-10 NOTE — Telephone Encounter (Signed)
Pt calling requesting a refill on Amlodipine 2.5 mg tablet sent to [facility] on [address]. Please address

## 2019-05-19 DIAGNOSIS — Z0120 Encounter for dental examination and cleaning without abnormal findings: Secondary | ICD-10-CM | POA: Diagnosis not present

## 2019-05-26 DIAGNOSIS — G4700 Insomnia, unspecified: Secondary | ICD-10-CM | POA: Diagnosis not present

## 2019-05-26 DIAGNOSIS — E559 Vitamin D deficiency, unspecified: Secondary | ICD-10-CM | POA: Diagnosis not present

## 2019-05-26 DIAGNOSIS — M810 Age-related osteoporosis without current pathological fracture: Secondary | ICD-10-CM | POA: Diagnosis not present

## 2019-05-26 DIAGNOSIS — I10 Essential (primary) hypertension: Secondary | ICD-10-CM | POA: Diagnosis not present

## 2019-06-01 DIAGNOSIS — E559 Vitamin D deficiency, unspecified: Secondary | ICD-10-CM | POA: Diagnosis not present

## 2019-06-01 DIAGNOSIS — M810 Age-related osteoporosis without current pathological fracture: Secondary | ICD-10-CM | POA: Diagnosis not present

## 2019-06-01 DIAGNOSIS — G4700 Insomnia, unspecified: Secondary | ICD-10-CM | POA: Diagnosis not present

## 2019-06-01 DIAGNOSIS — I10 Essential (primary) hypertension: Secondary | ICD-10-CM | POA: Diagnosis not present

## 2019-08-18 ENCOUNTER — Other Ambulatory Visit: Payer: Self-pay | Admitting: Family Medicine

## 2019-08-18 DIAGNOSIS — Z1231 Encounter for screening mammogram for malignant neoplasm of breast: Secondary | ICD-10-CM

## 2019-09-02 DIAGNOSIS — E559 Vitamin D deficiency, unspecified: Secondary | ICD-10-CM | POA: Diagnosis not present

## 2019-09-30 ENCOUNTER — Ambulatory Visit: Payer: Medicare Other

## 2019-10-20 DIAGNOSIS — Z20818 Contact with and (suspected) exposure to other bacterial communicable diseases: Secondary | ICD-10-CM | POA: Diagnosis not present

## 2019-10-20 DIAGNOSIS — J029 Acute pharyngitis, unspecified: Secondary | ICD-10-CM | POA: Diagnosis not present

## 2019-10-31 DIAGNOSIS — Z23 Encounter for immunization: Secondary | ICD-10-CM | POA: Diagnosis not present

## 2019-11-09 ENCOUNTER — Ambulatory Visit
Admission: RE | Admit: 2019-11-09 | Discharge: 2019-11-09 | Disposition: A | Discharge status: Home / Self Care | Payer: Medicare Other | Source: Ambulatory Visit | Attending: Family Medicine | Admitting: Family Medicine

## 2019-11-09 ENCOUNTER — Other Ambulatory Visit: Payer: Self-pay

## 2019-11-09 DIAGNOSIS — Z1231 Encounter for screening mammogram for malignant neoplasm of breast: Secondary | ICD-10-CM

## 2019-11-14 NOTE — Progress Notes (Signed)
HPI: FU chest pain. Echocardiogram in December of 2006 showed normal LV function. Cardiac catheterization in December 2006, which showed normal coronaries and good LV function. Stress test 2010 with question of lateral wall ischemia; cath showed normal cors and normal EF. Echocardiogram repeated October 2015 and showed normal LV function. Since last seen,  there is no dyspnea, chest pain, palpitations or syncope.  She is exercising routinely with no symptoms.  Current Outpatient Medications  Medication Sig Dispense Refill  . amLODipine (NORVASC) 2.5 MG tablet Take 1 tablet (2.5 mg total) by mouth daily. 90 tablet 3  . CALCIUM-MAGNESIUM-ZINC PO Take 1,500 capsules by mouth daily.    Marland Kitchen ibandronate (BONIVA) 150 MG tablet Take 150 mg by mouth every 30 (thirty) days. Take in the morning with a full glass of water, on an empty stomach, and do not take anything else by mouth or lie down for the next 30 min.     . Loratadine (CLARITIN PO) Take by mouth daily.    . nitroGLYCERIN (NITROSTAT) 0.4 MG SL tablet Place 1 tablet (0.4 mg total) under the tongue every 5 (five) minutes as needed. 25 tablet 2  . valACYclovir (VALTREX) 1000 MG tablet Take 1,000 mg by mouth as needed.    . zolpidem (AMBIEN) 5 MG tablet Take 5 mg by mouth at bedtime.      No current facility-administered medications for this visit.      Past Medical History:  Diagnosis Date  . Chest pain    with abnormal EKG. cardiac cath 12/06- normal coronaries, normal EF. cath 10/10- normal coronaries and EF  . HTN (hypertension)   . Kidney stone   . Kidney stones     Past Surgical History:  Procedure Laterality Date  . LAPAROSCOPY    . 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 Marital status: Married    Spouse name: Not on file  . Number of children: Not on file  . Years of education: Not on file  . Highest education level: Not on file  Occupational History  . Not on  file  Social Needs  . Financial resource strain: Not on file  . Food insecurity    Worry: Not on file    Inability: Not on file  . Transportation needs    Medical: Not on file    Non-medical: Not on file  Tobacco Use  . Smoking status: Never Smoker  . Smokeless tobacco: Never Used  Substance and Sexual Activity  . Alcohol use: No  . Drug use: No  . Sexual activity: Not on file  Lifestyle  . Physical activity    Days per week: Not on file    Minutes per session: Not on file  . Stress: Not on file  Relationships  . Social Musician on phone: Not on file    Gets together: Not on file    Attends religious service: Not on file    Active member of club or organization: Not on file    Attends meetings of clubs or organizations: Not on file    Relationship status: Not on file  . Intimate partner violence    Fear of current or ex partner: Not on file    Emotionally abused: Not on file    Physically abused: Not on file    Forced sexual activity: Not on file  Other Topics Concern  .  Not on file  Social History Narrative   Married, homemaker. Is active in yoga and low-impact aerobics.     Family History  Problem Relation Age of Onset  . Hypertension Mother     ROS: no fevers or chills, productive cough, hemoptysis, dysphasia, odynophagia, melena, hematochezia, dysuria, hematuria, rash, seizure activity, orthopnea, PND, pedal edema, claudication. Remaining systems are negative.  Physical Exam: Well-developed well-nourished in no acute distress.  Skin is warm and dry.  HEENT is normal.  Neck is supple.  Chest is clear to auscultation with normal expansion.  Cardiovascular exam is regular rate and rhythm.  Abdominal exam nontender or distended. No masses palpated. Extremities show no edema. neuro grossly intact  ECG-sinus rhythm at a rate of 60, septal infarct, left ventricular hypertrophy, ST depression in inferolateral leads.  Unchanged compared to October 01, 2018.  Personally reviewed  A/P  1 chest pain-no recurrent symptoms.  Patient has had previous catheterizations revealing no coronary disease.  No plans for further evaluation at this time.  2 abnormal electrocardiogram-ECG is unchanged compared to previous.  I have asked her previously to keep a copy for comparison for future evaluations in the emergency room.  3 hypertension-blood pressure borderline but she states typically controlled at home.  Continue amlodipine and follow.  Kirk Ruths, MD

## 2019-11-17 ENCOUNTER — Encounter: Payer: Self-pay | Admitting: Cardiology

## 2019-11-17 ENCOUNTER — Ambulatory Visit (INDEPENDENT_AMBULATORY_CARE_PROVIDER_SITE_OTHER): Payer: Medicare Other | Admitting: Cardiology

## 2019-11-17 ENCOUNTER — Other Ambulatory Visit: Payer: Self-pay

## 2019-11-17 VITALS — BP 136/80 | HR 60 | Temp 97.9°F | Ht 64.0 in | Wt 152.0 lb

## 2019-11-17 DIAGNOSIS — R072 Precordial pain: Secondary | ICD-10-CM | POA: Diagnosis not present

## 2019-11-17 DIAGNOSIS — R9431 Abnormal electrocardiogram [ECG] [EKG]: Secondary | ICD-10-CM | POA: Diagnosis not present

## 2019-11-17 DIAGNOSIS — I10 Essential (primary) hypertension: Secondary | ICD-10-CM

## 2019-11-17 NOTE — Patient Instructions (Signed)

## 2020-01-23 ENCOUNTER — Other Ambulatory Visit: Payer: Self-pay

## 2020-01-23 MED ORDER — AMLODIPINE BESYLATE 2.5 MG PO TABS: 2.5000 mg | ORAL_TABLET | Freq: Every day | ORAL | 3 refills | Status: DC

## 2020-02-19 ENCOUNTER — Ambulatory Visit: Payer: Medicare Other | Attending: Internal Medicine

## 2020-02-19 DIAGNOSIS — Z23 Encounter for immunization: Secondary | ICD-10-CM | POA: Insufficient documentation

## 2020-02-19 NOTE — Progress Notes (Signed)
   Covid-19 Vaccination Clinic  Name:  Jeanne Wilson    MRN: [id number] DOB: [birth year]  02/19/2020  [name] was observed post Covid-19 immunization for 15 minutes without incidence. She was provided with Vaccine Information Sheet and instruction to access the V-Safe system.   [name] was instructed to call 911 with any severe reactions post vaccine: Marland Kitchen Difficulty breathing  . Swelling of your face and throat  . A fast heartbeat  . A bad rash all over your body  . Dizziness and weakness    Immunizations Administered    Name Date Dose VIS Date Route   Pfizer COVID-19 Vaccine 02/19/2020  8:37 AM 0.3 mL 12/09/2019 Intramuscular   Manufacturer: [employer], [employer]   Lot: [id number]   NDC: [address]-5

## 2020-03-13 ENCOUNTER — Ambulatory Visit: Payer: Medicare Other | Attending: Internal Medicine

## 2020-03-13 DIAGNOSIS — Z23 Encounter for immunization: Secondary | ICD-10-CM

## 2020-03-13 NOTE — Progress Notes (Signed)
   Covid-19 Vaccination Clinic  Name:  Jeanne Wilson    MRN: [id number] DOB: 1951-05-14  03/13/2020  [name] was observed post Covid-19 immunization for 15 minutes without incident. She was provided with Vaccine Information Sheet and instruction to access the V-Safe system.   [name] was instructed to call 911 with any severe reactions post vaccine: Marland Kitchen Difficulty breathing  . Swelling of face and throat  . A fast heartbeat  . A bad rash all over body  . Dizziness and weakness   Immunizations Administered    Name Date Dose VIS Date Route   Pfizer COVID-19 Vaccine 03/13/2020  3:12 PM 0.3 mL 12/09/2019 Intramuscular   Manufacturer: [employer], [employer]   Lot: [id number]   NDC: [address]-7

## 2020-04-13 DIAGNOSIS — I10 Essential (primary) hypertension: Secondary | ICD-10-CM | POA: Diagnosis not present

## 2020-04-13 DIAGNOSIS — E559 Vitamin D deficiency, unspecified: Secondary | ICD-10-CM | POA: Diagnosis not present

## 2020-04-13 DIAGNOSIS — G4700 Insomnia, unspecified: Secondary | ICD-10-CM | POA: Diagnosis not present

## 2020-04-13 DIAGNOSIS — M810 Age-related osteoporosis without current pathological fracture: Secondary | ICD-10-CM | POA: Diagnosis not present

## 2020-04-19 ENCOUNTER — Other Ambulatory Visit: Payer: Self-pay | Admitting: Family Medicine

## 2020-04-19 DIAGNOSIS — M810 Age-related osteoporosis without current pathological fracture: Secondary | ICD-10-CM

## 2020-05-17 DIAGNOSIS — Z0120 Encounter for dental examination and cleaning without abnormal findings: Secondary | ICD-10-CM | POA: Diagnosis not present

## 2020-05-18 ENCOUNTER — Other Ambulatory Visit: Payer: Self-pay

## 2020-05-18 ENCOUNTER — Ambulatory Visit
Admission: RE | Admit: 2020-05-18 | Discharge: 2020-05-18 | Disposition: A | Discharge status: Home / Self Care | Payer: Medicare Other | Source: Ambulatory Visit | Attending: Family Medicine | Admitting: Family Medicine

## 2020-05-18 DIAGNOSIS — M8589 Other specified disorders of bone density and structure, multiple sites: Secondary | ICD-10-CM | POA: Diagnosis not present

## 2020-05-18 DIAGNOSIS — Z780 Asymptomatic menopausal state: Secondary | ICD-10-CM | POA: Diagnosis not present

## 2020-05-18 DIAGNOSIS — M810 Age-related osteoporosis without current pathological fracture: Secondary | ICD-10-CM

## 2020-07-11 DIAGNOSIS — H6690 Otitis media, unspecified, unspecified ear: Secondary | ICD-10-CM | POA: Diagnosis not present

## 2020-07-11 DIAGNOSIS — H6090 Unspecified otitis externa, unspecified ear: Secondary | ICD-10-CM | POA: Diagnosis not present

## 2020-07-11 DIAGNOSIS — H9201 Otalgia, right ear: Secondary | ICD-10-CM | POA: Diagnosis not present

## 2020-08-18 DIAGNOSIS — M79642 Pain in left hand: Secondary | ICD-10-CM | POA: Diagnosis not present

## 2020-08-18 DIAGNOSIS — S6992XA Unspecified injury of left wrist, hand and finger(s), initial encounter: Secondary | ICD-10-CM | POA: Diagnosis not present

## 2020-08-18 DIAGNOSIS — W230XXA Caught, crushed, jammed, or pinched between moving objects, initial encounter: Secondary | ICD-10-CM | POA: Diagnosis not present

## 2020-09-24 DIAGNOSIS — Z13220 Encounter for screening for lipoid disorders: Secondary | ICD-10-CM | POA: Diagnosis not present

## 2020-09-24 DIAGNOSIS — E559 Vitamin D deficiency, unspecified: Secondary | ICD-10-CM | POA: Diagnosis not present

## 2020-09-24 DIAGNOSIS — I10 Essential (primary) hypertension: Secondary | ICD-10-CM | POA: Diagnosis not present

## 2020-09-27 DIAGNOSIS — I10 Essential (primary) hypertension: Secondary | ICD-10-CM | POA: Diagnosis not present

## 2020-09-27 DIAGNOSIS — Z0000 Encounter for general adult medical examination without abnormal findings: Secondary | ICD-10-CM | POA: Diagnosis not present

## 2020-09-27 DIAGNOSIS — Z23 Encounter for immunization: Secondary | ICD-10-CM | POA: Diagnosis not present

## 2020-09-27 DIAGNOSIS — E559 Vitamin D deficiency, unspecified: Secondary | ICD-10-CM | POA: Diagnosis not present

## 2020-11-15 NOTE — Progress Notes (Signed)
HPI: FUchest pain. Echocardiogram in December of 2006 showed normal LV function. Cardiac catheterization in December 2006, which showed normal coronaries and good LV function. Stress test 2010 with question of lateral wall ischemia; cath showed normal cors and normal EF. Echocardiogram repeated October 2015 and showed normal LV function. Since last seen,she denies dyspnea, chest pain, palpitations or syncope.  Current Outpatient Medications  Medication Sig Dispense Refill  . amLODipine (NORVASC) 2.5 MG tablet Take 1 tablet (2.5 mg total) by mouth daily. 90 tablet 3  . CALCIUM-MAGNESIUM-ZINC PO Take 1,500 capsules by mouth daily.    Marland Kitchen ibandronate (BONIVA) 150 MG tablet Take 150 mg by mouth every 30 (thirty) days. Take in the morning with a full glass of water, on an empty stomach, and do not take anything else by mouth or lie down for the next 30 min.     . Loratadine (CLARITIN PO) Take by mouth daily.    . nitroGLYCERIN (NITROSTAT) 0.4 MG SL tablet Place 1 tablet (0.4 mg total) under the tongue every 5 (five) minutes as needed. 25 tablet 2  . valACYclovir (VALTREX) 1000 MG tablet Take 1,000 mg by mouth as needed.    . zolpidem (AMBIEN) 5 MG tablet Take 5 mg by mouth at bedtime.      No current facility-administered medications for this visit.     Past Medical History:  Diagnosis Date  . Chest pain    with abnormal EKG. cardiac cath 12/06- normal coronaries, normal EF. cath 10/10- normal coronaries and EF  . HTN (hypertension)   . Kidney stone   . Kidney stones     Past Surgical History:  Procedure Laterality Date  . LAPAROSCOPY    . 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 Marital status: Married    Spouse name: Not on file  . Number of children: Not on file  . Years of education: Not on file  . Highest education level: Not on file  Occupational History  . Not on file  Tobacco Use  . Smoking status: Never Smoker   . Smokeless tobacco: Never Used  Substance and Sexual Activity  . Alcohol use: No  . Drug use: No  . Sexual activity: Not on file  Other Topics Concern  . Not on file  Social History Narrative   Married, homemaker. Is active in yoga and low-impact aerobics.    Social Determinants of Health   Financial Resource Strain:   . Difficulty of Paying Living Expenses: Not on file  Food Insecurity:   . Worried About Programme researcher, broadcasting/film/video in the Last Year: Not on file  . Ran Out of Food in the Last Year: Not on file  Transportation Needs:   . Lack of Transportation (Medical): Not on file  . Lack of Transportation (Non-Medical): Not on file  Physical Activity:   . Days of Exercise per Week: Not on file  . Minutes of Exercise per Session: Not on file  Stress:   . Feeling of Stress : Not on file  Social Connections:   . Frequency of Communication with Friends and Family: Not on file  . Frequency of Social Gatherings with Friends and Family: Not on file  . Attends Religious Services: Not on file  . Active Member of Clubs or Organizations: Not on file  . Attends Banker Meetings: Not on file  . Marital Status: Not on file  Intimate Partner Violence:   . Fear of Current or Ex-Partner: Not on file  . Emotionally Abused: Not on file  . Physically Abused: Not on file  . Sexually Abused: Not on file    Family History  Problem Relation Age of Onset  . Hypertension Mother     ROS: no fevers or chills, productive cough, hemoptysis, dysphasia, odynophagia, melena, hematochezia, dysuria, hematuria, rash, seizure activity, orthopnea, PND, pedal edema, claudication. Remaining systems are negative.  Physical Exam: Well-developed well-nourished in no acute distress.  Skin is warm and dry.  HEENT is normal.  Neck is supple.  Chest is clear to auscultation with normal expansion.  Cardiovascular exam is regular rate and rhythm.  Abdominal exam nontender or distended. No masses  palpated. Extremities show no edema. neuro grossly intact  ECG-normal sinus rhythm at a rate of 60, left ventricular hypertrophy, cannot rule out septal infarct, diffuse ST depression.  Electrocardiogram is unchanged compared to November 17, 2019.  Personally reviewed  A/P  1 chest pain-previous catheterization revealed no coronary disease.  She is not having symptoms at present.  No plans for further ischemia evaluation.  2 hypertension-patient's blood pressure is borderline; however controlled at home.  Continue amlodipine at present dose.  Can advance in the future if needed.  3 history of abnormal electrocardiogram-unchanged today.  Olga Millers, MD

## 2020-11-26 ENCOUNTER — Other Ambulatory Visit: Payer: Self-pay

## 2020-11-26 ENCOUNTER — Ambulatory Visit: Payer: Medicare Other | Admitting: Cardiology

## 2020-11-26 ENCOUNTER — Encounter: Payer: Self-pay | Admitting: Cardiology

## 2020-11-26 VITALS — BP 140/68 | HR 60 | Ht 64.0 in | Wt 152.0 lb

## 2020-11-26 DIAGNOSIS — I10 Essential (primary) hypertension: Secondary | ICD-10-CM | POA: Diagnosis not present

## 2020-11-26 DIAGNOSIS — R9431 Abnormal electrocardiogram [ECG] [EKG]: Secondary | ICD-10-CM | POA: Diagnosis not present

## 2020-11-26 DIAGNOSIS — R072 Precordial pain: Secondary | ICD-10-CM | POA: Diagnosis not present

## 2020-11-26 NOTE — Patient Instructions (Signed)

## 2021-01-04 ENCOUNTER — Ambulatory Visit: Payer: Medicare Other | Attending: Internal Medicine

## 2021-01-04 DIAGNOSIS — Z23 Encounter for immunization: Secondary | ICD-10-CM

## 2021-01-04 NOTE — Progress Notes (Signed)
   Covid-19 Vaccination Clinic  Name:  Jeanne Wilson    MRN: [id number] DOB: 12-23-1951  01/04/2021  [name] was observed post Covid-19 immunization for 15 minutes without incident. She was provided with Vaccine Information Sheet and instruction to access the V-Safe system.   [name] was instructed to call 911 with any severe reactions post vaccine: Marland Kitchen Difficulty breathing  . Swelling of face and throat  . A fast heartbeat  . A bad rash all over body  . Dizziness and weakness   Immunizations Administered    Name Date Dose VIS Date Route   Pfizer COVID-19 Vaccine 01/04/2021  2:17 PM 0.3 mL 10/17/2020 Intramuscular   Manufacturer: [employer], [employer]   Lot: [id number]   NDC: [address]-6

## 2021-02-01 DIAGNOSIS — Z87442 Personal history of urinary calculi: Secondary | ICD-10-CM | POA: Diagnosis not present

## 2021-02-01 DIAGNOSIS — R350 Frequency of micturition: Secondary | ICD-10-CM | POA: Diagnosis not present

## 2021-02-01 DIAGNOSIS — R1084 Generalized abdominal pain: Secondary | ICD-10-CM | POA: Diagnosis not present

## 2021-02-01 DIAGNOSIS — N130 Hydronephrosis with ureteropelvic junction obstruction: Secondary | ICD-10-CM | POA: Diagnosis not present

## 2021-02-15 ENCOUNTER — Other Ambulatory Visit: Payer: Self-pay | Admitting: Family Medicine

## 2021-02-15 DIAGNOSIS — Z1231 Encounter for screening mammogram for malignant neoplasm of breast: Secondary | ICD-10-CM

## 2021-02-18 ENCOUNTER — Ambulatory Visit
Admission: RE | Admit: 2021-02-18 | Discharge: 2021-02-18 | Disposition: A | Discharge status: Home / Self Care | Payer: Medicare Other | Source: Ambulatory Visit

## 2021-02-18 ENCOUNTER — Other Ambulatory Visit: Payer: Self-pay

## 2021-02-18 DIAGNOSIS — N200 Calculus of kidney: Secondary | ICD-10-CM | POA: Diagnosis not present

## 2021-02-18 DIAGNOSIS — Z1231 Encounter for screening mammogram for malignant neoplasm of breast: Secondary | ICD-10-CM

## 2021-02-27 ENCOUNTER — Other Ambulatory Visit: Payer: Self-pay

## 2021-02-27 MED ORDER — AMLODIPINE BESYLATE 2.5 MG PO TABS: 2.5000 mg | ORAL_TABLET | Freq: Every day | ORAL | 3 refills | Status: DC

## 2021-04-18 DIAGNOSIS — Z23 Encounter for immunization: Secondary | ICD-10-CM | POA: Diagnosis not present

## 2021-04-18 DIAGNOSIS — G4700 Insomnia, unspecified: Secondary | ICD-10-CM | POA: Diagnosis not present

## 2021-04-18 DIAGNOSIS — E559 Vitamin D deficiency, unspecified: Secondary | ICD-10-CM | POA: Diagnosis not present

## 2021-04-18 DIAGNOSIS — I10 Essential (primary) hypertension: Secondary | ICD-10-CM | POA: Diagnosis not present

## 2021-07-03 DIAGNOSIS — Z6825 Body mass index (BMI) 25.0-25.9, adult: Secondary | ICD-10-CM | POA: Diagnosis not present

## 2021-07-03 DIAGNOSIS — R059 Cough, unspecified: Secondary | ICD-10-CM | POA: Diagnosis not present

## 2021-07-21 DIAGNOSIS — N3000 Acute cystitis without hematuria: Secondary | ICD-10-CM | POA: Diagnosis not present

## 2021-07-31 DIAGNOSIS — N200 Calculus of kidney: Secondary | ICD-10-CM | POA: Diagnosis not present

## 2021-08-20 DIAGNOSIS — Z86010 Personal history of colonic polyps: Secondary | ICD-10-CM | POA: Diagnosis not present

## 2021-08-20 DIAGNOSIS — I341 Nonrheumatic mitral (valve) prolapse: Secondary | ICD-10-CM | POA: Diagnosis not present

## 2021-08-20 DIAGNOSIS — R194 Change in bowel habit: Secondary | ICD-10-CM | POA: Diagnosis not present

## 2021-09-24 DIAGNOSIS — H01001 Unspecified blepharitis right upper eyelid: Secondary | ICD-10-CM | POA: Diagnosis not present

## 2021-09-24 DIAGNOSIS — H1033 Unspecified acute conjunctivitis, bilateral: Secondary | ICD-10-CM | POA: Diagnosis not present

## 2021-09-30 DIAGNOSIS — Z23 Encounter for immunization: Secondary | ICD-10-CM | POA: Diagnosis not present

## 2021-09-30 DIAGNOSIS — Z0000 Encounter for general adult medical examination without abnormal findings: Secondary | ICD-10-CM | POA: Diagnosis not present

## 2021-09-30 DIAGNOSIS — Z1159 Encounter for screening for other viral diseases: Secondary | ICD-10-CM | POA: Diagnosis not present

## 2021-09-30 DIAGNOSIS — Z13220 Encounter for screening for lipoid disorders: Secondary | ICD-10-CM | POA: Diagnosis not present

## 2021-09-30 DIAGNOSIS — Z136 Encounter for screening for cardiovascular disorders: Secondary | ICD-10-CM | POA: Diagnosis not present

## 2021-10-09 DIAGNOSIS — H524 Presbyopia: Secondary | ICD-10-CM | POA: Diagnosis not present

## 2021-10-09 DIAGNOSIS — H40033 Anatomical narrow angle, bilateral: Secondary | ICD-10-CM | POA: Diagnosis not present

## 2021-10-09 DIAGNOSIS — H2513 Age-related nuclear cataract, bilateral: Secondary | ICD-10-CM | POA: Diagnosis not present

## 2021-11-01 DIAGNOSIS — L03113 Cellulitis of right upper limb: Secondary | ICD-10-CM | POA: Diagnosis not present

## 2021-11-01 DIAGNOSIS — S61511D Laceration without foreign body of right wrist, subsequent encounter: Secondary | ICD-10-CM | POA: Diagnosis not present

## 2021-11-01 DIAGNOSIS — Z6825 Body mass index (BMI) 25.0-25.9, adult: Secondary | ICD-10-CM | POA: Diagnosis not present

## 2021-12-02 DIAGNOSIS — D128 Benign neoplasm of rectum: Secondary | ICD-10-CM | POA: Diagnosis not present

## 2021-12-02 DIAGNOSIS — D125 Benign neoplasm of sigmoid colon: Secondary | ICD-10-CM | POA: Diagnosis not present

## 2021-12-02 DIAGNOSIS — Z86010 Personal history of colonic polyps: Secondary | ICD-10-CM | POA: Diagnosis not present

## 2021-12-02 DIAGNOSIS — K648 Other hemorrhoids: Secondary | ICD-10-CM | POA: Diagnosis not present

## 2021-12-02 DIAGNOSIS — K5730 Diverticulosis of large intestine without perforation or abscess without bleeding: Secondary | ICD-10-CM | POA: Diagnosis not present

## 2021-12-04 DIAGNOSIS — D125 Benign neoplasm of sigmoid colon: Secondary | ICD-10-CM | POA: Diagnosis not present

## 2021-12-04 DIAGNOSIS — D128 Benign neoplasm of rectum: Secondary | ICD-10-CM | POA: Diagnosis not present

## 2021-12-26 NOTE — Progress Notes (Signed)
HPI: FU chest pain. Echocardiogram in December of 2006 showed normal LV function. Cardiac catheterization in December 2006, which showed normal coronaries and good LV function. Stress test 2010 with question of lateral wall ischemia; cath showed normal cors and normal EF. Echocardiogram repeated October 2015 and showed normal LV function. Since last seen, she denies dyspnea, chest pain, palpitations or syncope.  Current Outpatient Medications  Medication Sig Dispense Refill   amLODipine (NORVASC) 2.5 MG tablet Take 1 tablet (2.5 mg total) by mouth daily. 90 tablet 3   CALCIUM-MAGNESIUM-ZINC PO Take 1,500 capsules by mouth daily.     ibandronate (BONIVA) 150 MG tablet Take 150 mg by mouth every 30 (thirty) days. Take in the morning with a full glass of water, on an empty stomach, and do not take anything else by mouth or lie down for the next 30 min.      Loratadine (CLARITIN PO) Take by mouth daily.     nitroGLYCERIN (NITROSTAT) 0.4 MG SL tablet Place 1 tablet (0.4 mg total) under the tongue every 5 (five) minutes as needed. 25 tablet 2   valACYclovir (VALTREX) 1000 MG tablet Take 1,000 mg by mouth as needed.     zolpidem (AMBIEN) 5 MG tablet Take 5 mg by mouth at bedtime.      No current facility-administered medications for this visit.     Past Medical History:  Diagnosis Date   Chest pain    with abnormal EKG. cardiac cath 12/06- normal coronaries, normal EF. 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 Is active in yoga and low-impact aerobics.    Social Determinants of Health   Financial Resource Strain: Not on file  Food Insecurity: Not on file  Transportation Needs: Not on file  Physical Activity: Not on file  Stress: Not on file  Social Connections: Not on file  Intimate Partner Violence: Not on file    Family History  Problem Relation Age of Onset   Hypertension Mother     ROS: no fevers or chills, productive cough, hemoptysis, dysphasia, odynophagia, melena, hematochezia, dysuria, hematuria, rash, seizure activity, orthopnea, PND, pedal edema, claudication. Remaining systems are negative.  Physical Exam: Well-developed well-nourished in no acute distress.  Skin is warm and dry.  HEENT is normal.  Neck is supple.  Chest is clear to auscultation with normal expansion.  Cardiovascular exam is regular rate and rhythm.  Abdominal exam nontender or distended. No masses palpated. Extremities show no edema. neuro grossly intact  ECG-normal sinus rhythm at a rate of 72, left ventricular hypertrophy, marked inferolateral ST depression unchanged compared to previous.  Personally reviewed  A/P  1 history of chest pain-previous catheterization revealed no coronary disease and she has had no recurrent symptoms.  2 hypertension-patient's blood pressure is controlled.  Continue present medications and follow.  3 history of abnormal ECG-unchanged.  She appears to have significant left ventricular hypertrophy.  There is no murmur on examination to suggest hypertrophic cardiomyopathy.  We will repeat echocardiogram.  Kirk Ruths, MD

## 2022-01-07 ENCOUNTER — Encounter: Payer: Self-pay | Admitting: Cardiology

## 2022-01-07 ENCOUNTER — Other Ambulatory Visit: Payer: Self-pay

## 2022-01-07 ENCOUNTER — Ambulatory Visit: Payer: Medicare Other | Admitting: Cardiology

## 2022-01-07 VITALS — BP 128/64 | HR 72 | Ht 64.0 in | Wt 153.4 lb

## 2022-01-07 DIAGNOSIS — I10 Essential (primary) hypertension: Secondary | ICD-10-CM | POA: Diagnosis not present

## 2022-01-07 DIAGNOSIS — R072 Precordial pain: Secondary | ICD-10-CM | POA: Diagnosis not present

## 2022-01-07 DIAGNOSIS — R9431 Abnormal electrocardiogram [ECG] [EKG]: Secondary | ICD-10-CM

## 2022-01-07 NOTE — Patient Instructions (Signed)

## 2022-01-16 ENCOUNTER — Ambulatory Visit (HOSPITAL_COMMUNITY): Payer: Medicare Other | Attending: Cardiology

## 2022-01-16 ENCOUNTER — Other Ambulatory Visit: Payer: Self-pay

## 2022-01-16 DIAGNOSIS — R9431 Abnormal electrocardiogram [ECG] [EKG]: Secondary | ICD-10-CM | POA: Insufficient documentation

## 2022-01-16 LAB — ECHOCARDIOGRAM COMPLETE
Area-P 1/2: 4.39 cm2
S' Lateral: 3.1 cm

## 2022-04-02 ENCOUNTER — Other Ambulatory Visit: Payer: Self-pay | Admitting: Family Medicine

## 2022-04-02 DIAGNOSIS — Z1231 Encounter for screening mammogram for malignant neoplasm of breast: Secondary | ICD-10-CM

## 2022-04-10 ENCOUNTER — Ambulatory Visit
Admission: RE | Admit: 2022-04-10 | Discharge: 2022-04-10 | Disposition: A | Discharge status: Home / Self Care | Payer: Medicare Other | Source: Ambulatory Visit | Attending: Family Medicine | Admitting: Family Medicine

## 2022-04-10 DIAGNOSIS — Z1231 Encounter for screening mammogram for malignant neoplasm of breast: Secondary | ICD-10-CM

## 2022-06-23 ENCOUNTER — Other Ambulatory Visit: Payer: Self-pay

## 2022-06-24 MED ORDER — AMLODIPINE BESYLATE 2.5 MG PO TABS
2.5000 mg | ORAL_TABLET | Freq: Every day | ORAL | 3 refills | Status: DC
Start: 2022-06-24 — End: 2024-06-04

## 2022-08-04 DIAGNOSIS — Z87442 Personal history of urinary calculi: Secondary | ICD-10-CM | POA: Diagnosis not present

## 2022-08-04 DIAGNOSIS — N200 Calculus of kidney: Secondary | ICD-10-CM | POA: Diagnosis not present

## 2022-08-04 DIAGNOSIS — N3020 Other chronic cystitis without hematuria: Secondary | ICD-10-CM | POA: Diagnosis not present

## 2022-10-02 DIAGNOSIS — R87610 Atypical squamous cells of undetermined significance on cytologic smear of cervix (ASC-US): Secondary | ICD-10-CM | POA: Diagnosis not present

## 2022-10-02 DIAGNOSIS — G4700 Insomnia, unspecified: Secondary | ICD-10-CM | POA: Diagnosis not present

## 2022-10-02 DIAGNOSIS — Z0000 Encounter for general adult medical examination without abnormal findings: Secondary | ICD-10-CM | POA: Diagnosis not present

## 2022-10-02 DIAGNOSIS — I10 Essential (primary) hypertension: Secondary | ICD-10-CM | POA: Diagnosis not present

## 2022-10-02 DIAGNOSIS — Z124 Encounter for screening for malignant neoplasm of cervix: Secondary | ICD-10-CM | POA: Diagnosis not present

## 2022-10-02 DIAGNOSIS — E559 Vitamin D deficiency, unspecified: Secondary | ICD-10-CM | POA: Diagnosis not present

## 2022-10-03 ENCOUNTER — Other Ambulatory Visit: Payer: Self-pay | Admitting: Family Medicine

## 2022-10-03 DIAGNOSIS — M810 Age-related osteoporosis without current pathological fracture: Secondary | ICD-10-CM

## 2022-10-14 DIAGNOSIS — Z6826 Body mass index (BMI) 26.0-26.9, adult: Secondary | ICD-10-CM | POA: Diagnosis not present

## 2022-10-14 DIAGNOSIS — M84375D Stress fracture, left foot, subsequent encounter for fracture with routine healing: Secondary | ICD-10-CM | POA: Diagnosis not present

## 2022-10-14 DIAGNOSIS — M79672 Pain in left foot: Secondary | ICD-10-CM | POA: Diagnosis not present

## 2022-10-22 DIAGNOSIS — S92355A Nondisplaced fracture of fifth metatarsal bone, left foot, initial encounter for closed fracture: Secondary | ICD-10-CM | POA: Diagnosis not present

## 2022-11-11 DIAGNOSIS — M84375D Stress fracture, left foot, subsequent encounter for fracture with routine healing: Secondary | ICD-10-CM | POA: Diagnosis not present

## 2022-12-03 DIAGNOSIS — M84375D Stress fracture, left foot, subsequent encounter for fracture with routine healing: Secondary | ICD-10-CM | POA: Diagnosis not present

## 2022-12-03 DIAGNOSIS — S92355D Nondisplaced fracture of fifth metatarsal bone, left foot, subsequent encounter for fracture with routine healing: Secondary | ICD-10-CM | POA: Diagnosis not present

## 2022-12-25 ENCOUNTER — Ambulatory Visit
Admission: RE | Admit: 2022-12-25 | Discharge: 2022-12-25 | Disposition: A | Discharge status: Home / Self Care | Payer: Medicare Other | Source: Ambulatory Visit | Attending: Family Medicine | Admitting: Family Medicine

## 2022-12-25 DIAGNOSIS — M8589 Other specified disorders of bone density and structure, multiple sites: Secondary | ICD-10-CM | POA: Diagnosis not present

## 2022-12-25 DIAGNOSIS — M810 Age-related osteoporosis without current pathological fracture: Secondary | ICD-10-CM

## 2022-12-25 DIAGNOSIS — Z780 Asymptomatic menopausal state: Secondary | ICD-10-CM | POA: Diagnosis not present

## 2022-12-30 DIAGNOSIS — S92355D Nondisplaced fracture of fifth metatarsal bone, left foot, subsequent encounter for fracture with routine healing: Secondary | ICD-10-CM | POA: Diagnosis not present

## 2023-01-20 DIAGNOSIS — S92355D Nondisplaced fracture of fifth metatarsal bone, left foot, subsequent encounter for fracture with routine healing: Secondary | ICD-10-CM | POA: Diagnosis not present

## 2023-02-11 DIAGNOSIS — K080 Exfoliation of teeth due to systemic causes: Secondary | ICD-10-CM | POA: Diagnosis not present

## 2023-02-23 DIAGNOSIS — K080 Exfoliation of teeth due to systemic causes: Secondary | ICD-10-CM | POA: Diagnosis not present

## 2023-03-04 NOTE — Progress Notes (Signed)
Cardiology Clinic Note   Patient Name: Jeanne Wilson Date of Encounter: 03/06/2023  Primary Care Provider:  Marda Stalker, PA-C Primary Cardiologist:  Jeanne Wilson   Patient Profile    72 year old female with history of chronic chest discomfort with normal cardiac catheterization 11/2005, hypertension, with EKG revealing will LVH.  When seen last by Jeanne Wilson on 01/07/2022 a repeat echocardiogram was completed.  LVEF was 54%, LV diastolic parameters were consistent with grade 1 diastolic dysfunction, there is no evidence of valvular disease.   Past Medical History    Past Medical History:  Diagnosis Date   Chest pain    with abnormal EKG. cardiac cath 12/06- normal coronaries, normal EF. 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  Most recent echocardiogram January 16, 2021 revealed normal LV systolic function with grade 1 diastolic dysfunction.  She was to remain on amlodipine 2.5 mg daily.  On today's visit she states that she has been having periods of angina described as ache in the middle of her chest usually at nighttime over the last couple of weeks.  She believes it is stress-induced because they are getting ready for a wedding this weekend, several family members are staying at their home, also they are planning on selling their house and getting it ready to be sold by next summer.  She finds this to be very stressful getting rid of things in order to downsize.  She states they will be moving to [place] to be near her grandchildren.  She states that normally she does not have any discomfort  in her chest.  The discomfort she is having as described was an achy feeling nonradiating, not associated with any weakness shortness of breath or dizziness.  She has not had to take any nitroglycerin for this.  She did get a new blood pressure machine.  Blood pressures have been running in the 008-676 range systolic over 78.  Compared to office visit home blood pressure readings are much higher.  Home Medications    Current Outpatient Medications  Medication Sig Dispense Refill   amLODipine (NORVASC) 2.5 MG tablet Take 1 tablet (2.5 mg total) by mouth daily. 90 tablet 3   atorvastatin (LIPITOR) 10 MG tablet Take 10 mg by mouth daily.     CALCIUM-MAGNESIUM-ZINC PO Take 1,500 capsules by mouth daily.     ibandronate (BONIVA) 150 MG tablet Take 150 mg by mouth every 30 (thirty) days. Take in the morning with a full glass of water, on an empty stomach, and do not take anything else by mouth or lie down for the next 30 min.      Loratadine (CLARITIN PO) Take by mouth daily.     Meth-Hyo-M Bl-Na Phos-Ph Sal (URIBEL) 118 MG CAPS Take 1 capsule by mouth as needed.     valACYclovir (VALTREX) 1000 MG tablet Take 1,000 mg by mouth as needed.     zolpidem (AMBIEN) 5 MG tablet Take 5 mg by mouth at bedtime.      nitroGLYCERIN (NITROSTAT) 0.4 MG SL tablet Place 1 tablet (  0.4 mg total) under the tongue every 5 (five) minutes as needed. 25 tablet 2   No current facility-administered medications for this visit.     Family History    Family History  Problem Relation Age of Onset   Hypertension Mother    She indicated that her mother is alive. She indicated that her father is alive.  Social History    Social History   Socioeconomic History   Marital status: Married    Spouse name: Not on file   Number of children: Not on file   Years of education: Not on file   Highest education level: Not on file  Occupational History   Not on file  Tobacco Use   Smoking status: Never   Smokeless tobacco:  Never  Substance and Sexual Activity   Alcohol use: No   Drug use: No   Sexual activity: Not on file  Other Topics Concern   Not on file  Social History Narrative   Married, homemaker. Is active in yoga and low-impact aerobics.    Social Determinants of Health   Financial Resource Strain: Not on file  Food Insecurity: Not on file  Transportation Needs: Not on file  Physical Activity: Not on file  Stress: Not on file  Social Connections: Not on file  Intimate Partner Violence: Not on file     Review of Systems    General:  No chills, fever, night sweats or weight changes.  Cardiovascular: Positive for achy chest pain, nonradiating, no dyspnea on exertion, edema, orthopnea, palpitations, paroxysmal nocturnal dyspnea. Dermatological: No rash, lesions/masses Respiratory: No cough, dyspnea Urologic: No hematuria, dysuria Abdominal:   No nausea, vomiting, diarrhea, bright red blood per rectum, melena, or hematemesis Neurologic:  No visual changes, wkns, changes in mental status. All other systems reviewed and are otherwise negative except as noted above.     Physical Exam    VS:  BP 118/82 (BP Location: Left Arm, Patient Position: Sitting, Cuff Size: Normal)   Ht 5\' 4"  (1.626 m)   Wt 154 lb 3.2 oz (69.9 kg)   BMI 26.47 kg/m  , BMI Body mass index is 26.47 kg/m.     GEN: Well nourished, well developed, in no acute distress. HEENT: normal. Neck: Supple, no JVD, carotid bruits, or masses. Cardiac: RRR, no murmurs, rubs, or gallops. No clubbing, cyanosis, edema.  Radials/DP/PT 2+ and equal bilaterally.  Respiratory:  Respirations regular and unlabored, clear to auscultation bilaterally. GI: Soft, nontender, nondistended, BS + x 4. MS: no deformity or atrophy. Skin: warm and dry, no rash. Neuro:  Strength and sensation are intact. Psych: Normal affect.  Accessory Clinical Findings    ECG personally reviewed by me today-sinus rhythm, LVH, repolarization abnormalities are  noted no acute changes from 01/07/2022.  Lab Results  Component Value Date   WBC 9.0 01/24/2018   HGB 13.9 01/24/2018   HCT 42.4 01/24/2018   MCV 99.3 01/24/2018   PLT 315 01/24/2018   Lab Results  Component Value Date   CREATININE 0.71 01/24/2018   BUN 12 01/24/2018   NA 138 01/24/2018   K 4.2 01/24/2018   CL 105 01/24/2018   CO2 21 (L) 01/24/2018   Lab Results  Component Value Date   ALT 19 01/24/2018   AST 25 01/24/2018   ALKPHOS 76 01/24/2018   BILITOT 0.8 01/24/2018   No results found for: "CHOL", "HDL", "LDLCALC", "LDLDIRECT", "TRIG", "CHOLHDL"  No results found for: "HGBA1C"  Review of Prior Studies: Echocardiogram 01/16/2022 1.  Left ventricular ejection fraction by 3D volume is 60 %. The left  ventricle has normal function. The left ventricle has no regional wall  motion abnormalities. Left ventricular diastolic parameters are consistent  with Grade I diastolic dysfunction  (impaired relaxation). The average left ventricular global longitudinal  strain is -20.4 %. The global longitudinal strain is normal.   2. Right ventricular systolic function is normal. The right ventricular  size is normal. Tricuspid regurgitation signal is inadequate for assessing  PA pressure.   3. Left atrial size was mildly dilated.   4. The mitral valve is normal in structure. Trivial mitral valve  regurgitation. No evidence of mitral stenosis.   5. The aortic valve is normal in structure. Aortic valve regurgitation is  not visualized. No aortic stenosis is present.   6. The inferior vena cava is normal in size with greater than 50%  respiratory variability, suggesting right atrial pressure of 3 mmHg.   Assessment & Plan   1.  Chest discomfort: Described as an ache in the middle of her chest normally feeling it at nighttime after a very stressful day.  She is under some stress this last couple of weeks with the wedding and planning on moving by cleaning out her home.  Several family  members staying with her currently over the next week.  Prior to the last 2 weeks she was not having any angina pain.  I rechecked her blood pressure to compare it to blood pressures at her home and found to be 136/68.  I will hold off on increasing amlodipine at this time.  I am going to have her bring her blood pressure machine back for a nurse check and calibration.  If she continues to have discomfort or finding elevated blood pressures will increase amlodipine to 5 mg daily.  I have given her a new prescription for nitroglycerin sublingual.  She will be back in a few days to recheck blood pressure..  2.  Hyperlipidemia: Remains on atorvastatin 10 mg daily.  This was newly placed by PCP who is following labs.    Current medicines are reviewed at length with the patient today.  I have spent 25 min's  dedicated to the care of this patient on the date of this encounter to include pre-visit review of records, assessment, management and diagnostic testing,with shared decision making. Signed, Phill Myron. West Pugh, ANP, [place]   03/06/2023 12:12 PM      Office [phone] Fax [phone]  Notice: This dictation was prepared with Dragon dictation along with smaller phrase technology. Any transcriptional errors that result from this process are unintentional and may not be corrected upon review.

## 2023-03-06 ENCOUNTER — Encounter: Payer: Self-pay | Admitting: Adult Health

## 2023-03-06 ENCOUNTER — Ambulatory Visit: Payer: Medicare Other | Attending: Adult Health | Admitting: Adult Health

## 2023-03-06 VITALS — BP 118/82 | Ht 64.0 in | Wt 154.2 lb

## 2023-03-06 DIAGNOSIS — R072 Precordial pain: Secondary | ICD-10-CM | POA: Diagnosis not present

## 2023-03-06 MED ORDER — NITROGLYCERIN 0.4 MG SL SUBL
0.4000 mg | SUBLINGUAL_TABLET | SUBLINGUAL | 2 refills | Status: DC | PRN
Start: 2023-03-06 — End: 2024-09-26

## 2023-03-06 NOTE — Patient Instructions (Signed)
Medication Instructions:  No Changes *If you need a refill on your cardiac medications before your next appointment, please call your pharmacy*   Lab Work: No labs If you have labs (blood work) drawn today and your tests are completely normal, you will receive your results only by: [place] (if you have MyChart) OR A paper copy in the mail If you have any lab test that is abnormal or we need to change your treatment, we will call you to review the results.   Testing/Procedures: No Testing   Follow-Up: At [facility], you and your health needs are our priority.  As part of our continuing mission to provide you with exceptional heart care, we have created designated Provider Care Teams.  These Care Teams include your primary Cardiologist (physician) and Advanced Practice Providers (APPs -  Physician Assistants and Nurse Practitioners) who all work together to provide you with the care you need, when you need it.  We recommend signing up for the patient portal called "MyChart".  Sign up information is provided on this After Visit Summary.  MyChart is used to connect with patients for Virtual Visits (Telemedicine).  Patients are able to view lab/test results, encounter notes, upcoming appointments, etc.  Non-urgent messages can be sent to your provider as well.   To learn more about what you can do with MyChart, go to [email or web address].    Your next appointment:   3 month(s)  Provider:   Jory Sims, NP

## 2023-03-11 ENCOUNTER — Ambulatory Visit: Payer: Medicare Other | Attending: Cardiovascular Disease | Admitting: Adult Health

## 2023-03-11 VITALS — BP 136/72 | HR 66 | Ht 64.0 in | Wt 154.0 lb

## 2023-03-11 DIAGNOSIS — I10 Essential (primary) hypertension: Secondary | ICD-10-CM

## 2023-03-25 ENCOUNTER — Other Ambulatory Visit: Payer: Self-pay | Admitting: Adult Health

## 2023-03-31 ENCOUNTER — Other Ambulatory Visit: Payer: Self-pay | Admitting: Family Medicine

## 2023-03-31 DIAGNOSIS — Z1231 Encounter for screening mammogram for malignant neoplasm of breast: Secondary | ICD-10-CM

## 2023-04-14 DIAGNOSIS — Z6826 Body mass index (BMI) 26.0-26.9, adult: Secondary | ICD-10-CM | POA: Diagnosis not present

## 2023-04-14 DIAGNOSIS — R87610 Atypical squamous cells of undetermined significance on cytologic smear of cervix (ASC-US): Secondary | ICD-10-CM | POA: Diagnosis not present

## 2023-05-01 DIAGNOSIS — Z6826 Body mass index (BMI) 26.0-26.9, adult: Secondary | ICD-10-CM | POA: Diagnosis not present

## 2023-05-01 DIAGNOSIS — R3915 Urgency of urination: Secondary | ICD-10-CM | POA: Diagnosis not present

## 2023-05-13 ENCOUNTER — Ambulatory Visit
Admission: RE | Admit: 2023-05-13 | Discharge: 2023-05-13 | Disposition: A | Discharge status: Home / Self Care | Payer: Medicare Other | Source: Ambulatory Visit | Attending: Family Medicine | Admitting: Family Medicine

## 2023-05-13 DIAGNOSIS — R300 Dysuria: Secondary | ICD-10-CM | POA: Diagnosis not present

## 2023-05-13 DIAGNOSIS — Z1231 Encounter for screening mammogram for malignant neoplasm of breast: Secondary | ICD-10-CM

## 2023-05-13 DIAGNOSIS — N200 Calculus of kidney: Secondary | ICD-10-CM | POA: Diagnosis not present

## 2023-05-13 DIAGNOSIS — N390 Urinary tract infection, site not specified: Secondary | ICD-10-CM | POA: Diagnosis not present

## 2023-06-02 NOTE — Progress Notes (Signed)
Cardiology Clinic Note   Patient Name: Jeanne Wilson Date of Encounter: 06/15/2023  Primary Care Provider:  Jarrett Soho, PA-C Primary Cardiologist:  Olga Millers, MD       Cardiology Clinic Note    Patient Name: Jeanne Wilson Date of Encounter: 03/06/2023   Primary Care Provider:  Jarrett Soho, PA-C Primary Cardiologist:  Dr.Crenshaw    Patient Profile    72 year old female with history of chronic chest discomfort with normal cardiac catheterization 11/2005, hypertension, with EKG revealing will LVH, echo 01/16/2022  LVEF was 60%, LV diastolic parameters were consistent with grade 1 diastolic dysfunction, there is no evidence of valvular disease.      Past Medical History    Past Medical History:  Diagnosis Date   Chest pain    with abnormal EKG. cardiac cath 12/06- normal coronaries, normal EF. 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  On last office visit dated 03/06/2023 she mentioned that her blood pressure was elevated at home but she was under stress getting ready for a wedding and having a lot of stress in her home.  No changes were made in her medication regimen.  She also remained on atorvastatin 10 mg daily which was started by her primary care provider.  Jeanne Wilson comes today without any complaints.  She has been medically compliant, no further complaints of chest pain which she experienced on last office visit during a stressful time in her family during a wedding with several gas in her home.  Since then she and her husband have traveled visiting family, she has had no issues with shortness  of breath or dyspnea on exertion.  She is tolerating Lipitor without myalgias.  She is due to have follow-up labs by PCP in October 2024.  Home Medications    Current Outpatient Medications  Medication Sig Dispense Refill   amLODipine (NORVASC) 2.5 MG tablet Take 1 tablet (2.5 mg total) by mouth daily. 90 tablet 3   atorvastatin (LIPITOR) 10 MG tablet Take 10 mg by mouth daily.     CALCIUM-MAGNESIUM-ZINC PO Take 1,500 capsules by mouth daily.     ibandronate (BONIVA) 150 MG tablet Take 150 mg by mouth every 30 (thirty) days. Take in the morning with a full glass of water, on an empty stomach, and do not take anything else by mouth or lie down for the next 30 min.      Loratadine (CLARITIN PO) Take by mouth daily.     Meth-Hyo-M Bl-Na Phos-Ph Sal (URIBEL) 118 MG CAPS Take 1 capsule by mouth as needed.     nitroGLYCERIN (NITROSTAT) 0.4 MG SL tablet Place 1 tablet (0.4 mg total) under the tongue every 5 (five) minutes as needed. 25 tablet 2   valACYclovir (VALTREX) 1000 MG tablet Take 1,000 mg by mouth as needed.     zolpidem (AMBIEN) 5 MG tablet Take 5 mg by mouth at bedtime.      No current facility-administered medications for this visit.     Family History    Family History  Problem Relation Age  of Onset   Hypertension Mother    She indicated that her mother is alive. She indicated that her father is alive.  Social History    Social History   Socioeconomic History   Marital status: Married    Spouse name: Not on file   Number of children: Not on file   Years of education: Not on file   Highest education level: Not on file  Occupational History   Not on file  Tobacco Use   Smoking status: Never   Smokeless tobacco: Never  Substance and Sexual Activity   Alcohol use: No   Drug use: No   Sexual activity: Not on file  Other Topics Concern   Not on file  Social History Narrative   Married, homemaker. Is active in yoga and low-impact aerobics.    Social Determinants of  Health   Financial Resource Strain: Not on file  Food Insecurity: Not on file  Transportation Needs: Not on file  Physical Activity: Not on file  Stress: Not on file  Social Connections: Not on file  Intimate Partner Violence: Not on file     Review of Systems    General:  No chills, fever, night sweats or weight changes.  Cardiovascular:  No chest pain, dyspnea on exertion, edema, orthopnea, palpitations, paroxysmal nocturnal dyspnea. Dermatological: No rash, lesions/masses Respiratory: No cough, dyspnea Urologic: No hematuria, dysuria Abdominal:   No nausea, vomiting, diarrhea, bright red blood per rectum, melena, or hematemesis Neurologic:  No visual changes, wkns, changes in mental status. All other systems reviewed and are otherwise negative except as noted above.     Physical Exam    VS:  BP 119/70   Pulse 70   Ht 5\' 4"  (1.626 m)   Wt 154 lb 6.4 oz (70 kg)   SpO2 99%   BMI 26.50 kg/m  , BMI Body mass index is 26.5 kg/m.     GEN: Well nourished, well developed, in no acute distress. HEENT: normal. Neck: Supple, no JVD, carotid bruits, or masses. Cardiac: RRR, 2/6 systolic murmurs heard at the apex and left sternal border, along with soft murmur heard at the left sternal border, no rubs, or gallops. No clubbing, cyanosis, edema.  Radials/DP/PT 2+ and equal bilaterally.  Respiratory:  Respirations regular and unlabored, clear to auscultation bilaterally. GI: Soft, nontender, nondistended, BS + x 4. MS: no deformity or atrophy. Skin: warm and dry, no rash. Neuro:  Strength and sensation are intact. Psych: Normal affect.  Accessory Clinical Findings    Lab Results  Component Value Date   WBC 9.0 01/24/2018   HGB 13.9 01/24/2018   HCT 42.4 01/24/2018   MCV 99.3 01/24/2018   PLT 315 01/24/2018   Lab Results  Component Value Date   CREATININE 0.71 01/24/2018   BUN 12 01/24/2018   NA 138 01/24/2018   K 4.2 01/24/2018   CL 105 01/24/2018   CO2 21 (L)  01/24/2018   Lab Results  Component Value Date   ALT 19 01/24/2018   AST 25 01/24/2018   ALKPHOS 76 01/24/2018   BILITOT 0.8 01/24/2018   No results found for: "CHOL", "HDL", "LDLCALC", "LDLDIRECT", "TRIG", "CHOLHDL"  No results found for: "HGBA1C"   Review of Prior Studies: Echocardiogram 01/16/2022 1. Left ventricular ejection fraction by 3D volume is 60 %. The left  ventricle has normal function. The left ventricle has no regional wall  motion abnormalities. Left ventricular diastolic parameters are consistent  with Grade I diastolic dysfunction  (impaired  relaxation). The average left ventricular global longitudinal  strain is -20.4 %. The global longitudinal strain is normal.   2. Right ventricular systolic function is normal. The right ventricular  size is normal. Tricuspid regurgitation signal is inadequate for assessing  PA pressure.   3. Left atrial size was mildly dilated.   4. The mitral valve is normal in structure. Trivial mitral valve  regurgitation. No evidence of mitral stenosis.   5. The aortic valve is normal in structure. Aortic valve regurgitation is  not visualized. No aortic stenosis is present.   6. The inferior vena cava is normal in size with greater than 50%  respiratory variability, suggesting right atrial pressure of 3 mmHg.   Assessment & Plan   1.  Noncardiac chest discomfort: She has not had any recurrence of this since having had stressful family situation on last office visit.  She has not had to take any nitroglycerin or had any change in her physical activity that would cause her to stop due to chest pain.  No changes in her medications.  Will see her in 1 year.  2.  Hypertension: Currently well-controlled on medications.  No changes.  3.  Hyperlipidemia: Has been placed on Lipitor by PCP 3 months ago.  Follow-up labs will be ordered by PCP in October 2024.         Signed, Bettey Mare. Liborio Nixon, ANP, AACC   06/15/2023 8:14 AM       Office [phone] Fax [phone]  Notice: This dictation was prepared with Dragon dictation along with smaller phrase technology. Any transcriptional errors that result from this process are unintentional and may not be corrected upon review.

## 2023-06-15 ENCOUNTER — Ambulatory Visit: Payer: Medicare Other | Attending: Adult Health | Admitting: Adult Health

## 2023-06-15 ENCOUNTER — Encounter: Payer: Self-pay | Admitting: Adult Health

## 2023-06-15 VITALS — BP 119/70 | HR 70 | Ht 64.0 in | Wt 154.4 lb

## 2023-06-15 DIAGNOSIS — I10 Essential (primary) hypertension: Secondary | ICD-10-CM

## 2023-06-15 DIAGNOSIS — R0789 Other chest pain: Secondary | ICD-10-CM

## 2023-06-15 DIAGNOSIS — E7800 Pure hypercholesterolemia, unspecified: Secondary | ICD-10-CM

## 2023-06-15 NOTE — Patient Instructions (Signed)
Medication Instructions:  No Changes *If you need a refill on your cardiac medications before your next appointment, please call your pharmacy*   Lab Work: No Labs If you have labs (blood work) drawn today and your tests are completely normal, you will receive your results only by: MyChart Message (if you have MyChart) OR A paper copy in the mail If you have any lab test that is abnormal or we need to change your treatment, we will call you to review the results.   Testing/Procedures: No Testing   Follow-Up: At [facility], you and your health needs are our priority.  As part of our continuing mission to provide you with exceptional heart care, we have created designated Provider Care Teams.  These Care Teams include your primary Cardiologist (physician) and Advanced Practice Providers (APPs -  Physician Assistants and Nurse Practitioners) who all work together to provide you with the care you need, when you need it.  We recommend signing up for the patient portal called "MyChart".  Sign up information is provided on this After Visit Summary.  MyChart is used to connect with patients for Virtual Visits (Telemedicine).  Patients are able to view lab/test results, encounter notes, upcoming appointments, etc.  Non-urgent messages can be sent to your provider as well.   To learn more about what you can do with MyChart, go to [email or web address].    Your next appointment:   1 year(s)  Provider:   Brian Crenshaw, MD   

## 2023-06-16 ENCOUNTER — Other Ambulatory Visit: Payer: Self-pay | Admitting: Internal Medicine

## 2023-08-19 DIAGNOSIS — U071 COVID-19: Secondary | ICD-10-CM | POA: Diagnosis not present

## 2023-09-25 DIAGNOSIS — N200 Calculus of kidney: Secondary | ICD-10-CM | POA: Diagnosis not present

## 2023-09-25 DIAGNOSIS — N3020 Other chronic cystitis without hematuria: Secondary | ICD-10-CM | POA: Diagnosis not present

## 2023-10-09 DIAGNOSIS — I10 Essential (primary) hypertension: Secondary | ICD-10-CM | POA: Diagnosis not present

## 2023-10-09 DIAGNOSIS — I341 Nonrheumatic mitral (valve) prolapse: Secondary | ICD-10-CM | POA: Diagnosis not present

## 2023-10-09 DIAGNOSIS — Z0000 Encounter for general adult medical examination without abnormal findings: Secondary | ICD-10-CM | POA: Diagnosis not present

## 2023-10-09 DIAGNOSIS — E559 Vitamin D deficiency, unspecified: Secondary | ICD-10-CM | POA: Diagnosis not present

## 2023-10-09 DIAGNOSIS — M810 Age-related osteoporosis without current pathological fracture: Secondary | ICD-10-CM | POA: Diagnosis not present

## 2023-10-14 DIAGNOSIS — M25551 Pain in right hip: Secondary | ICD-10-CM | POA: Diagnosis not present

## 2023-11-11 IMAGING — MG MM DIGITAL SCREENING BILAT W/ TOMO AND CAD
8 series · 8 of 24 positions shown · non-contrast
Comparison: Previous exam(s).

CLINICAL DATA: Screening.

EXAM:
DIGITAL SCREENING BILATERAL MAMMOGRAM WITH TOMOSYNTHESIS AND CAD
TECHNIQUE: Bilateral screening digital craniocaudal and mediolateral oblique
mammograms were obtained. Bilateral screening digital breast
tomosynthesis was performed. The images were evaluated with
computer-aided detection.

[L MLO synth-2D]
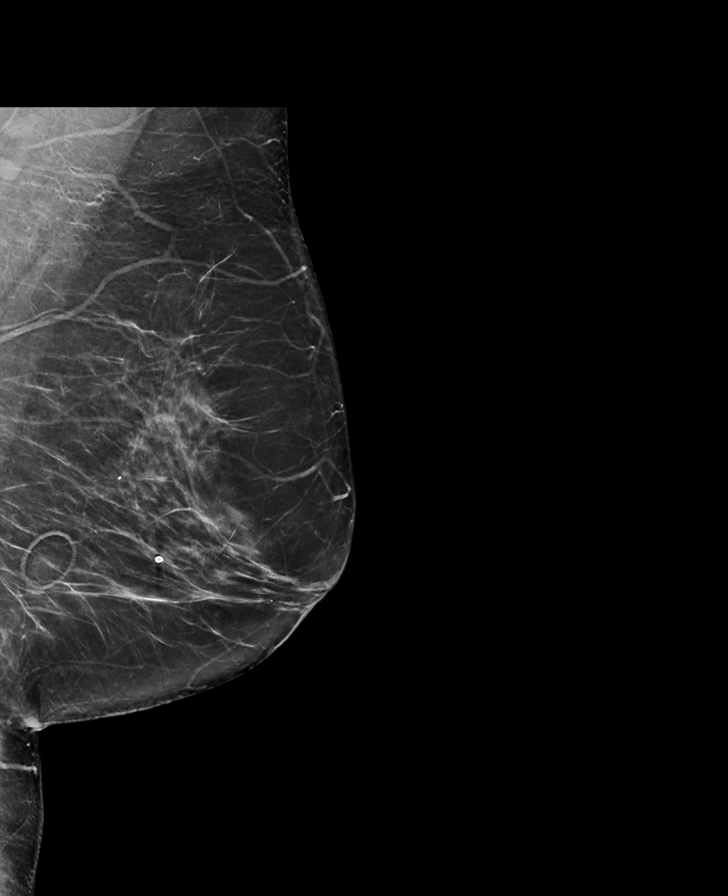

[L CC synth-2D]
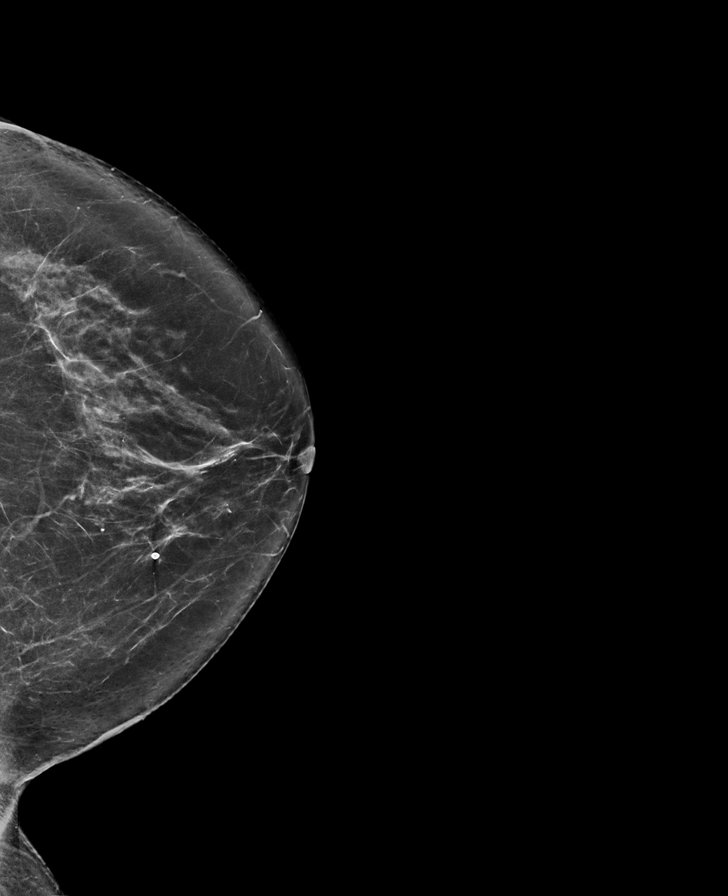

[R CC synth-2D]
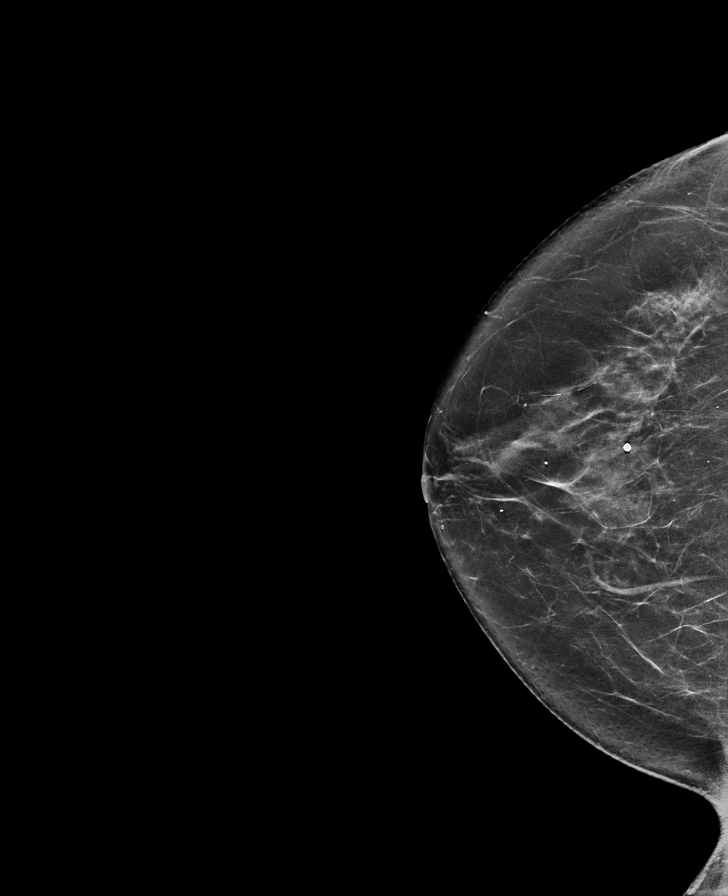

[R MLO synth-2D]
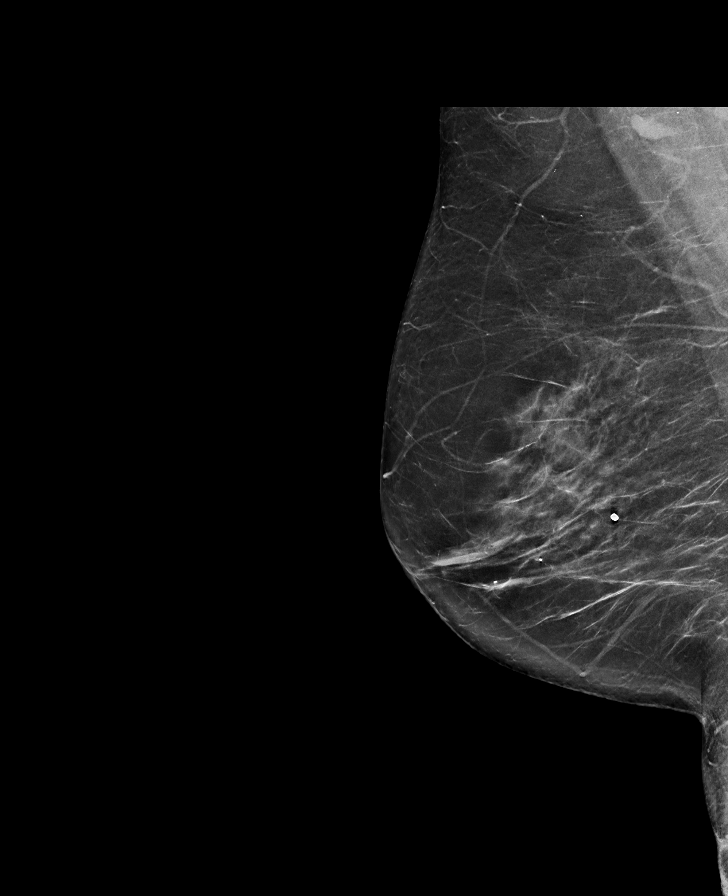

[R CC tomo · tomo slice 38/75.0]
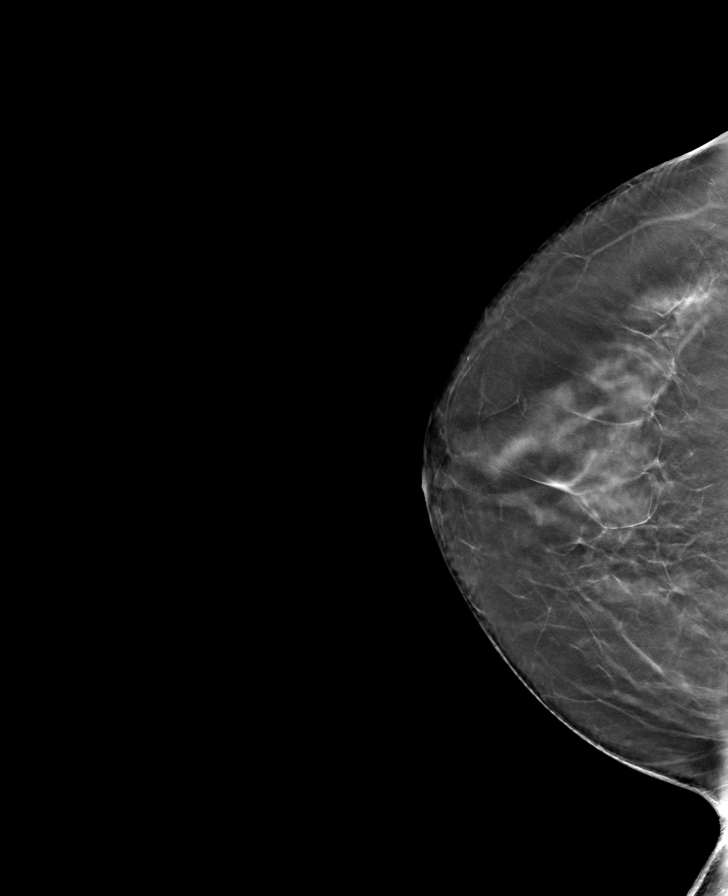

[R MLO tomo · tomo slice 41/82.0]
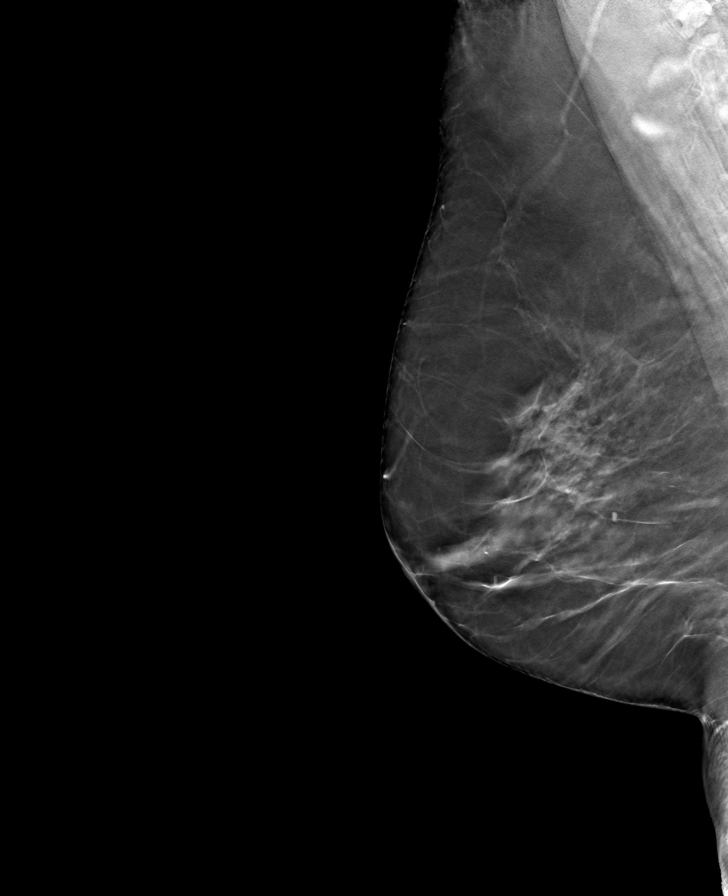

[L CC tomo · tomo slice 38/75.0]
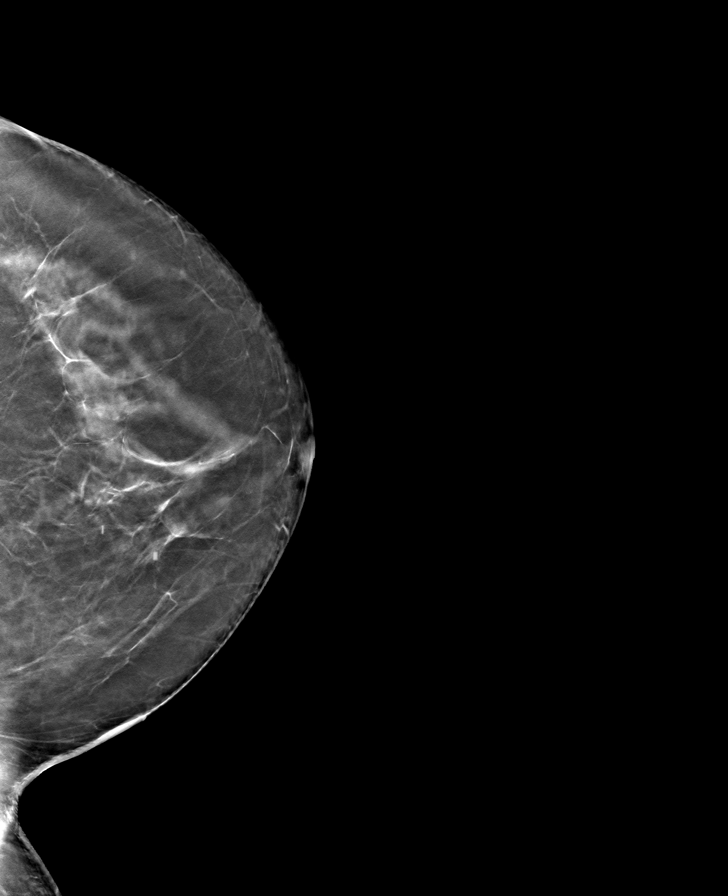

[L MLO tomo · tomo slice 41/81.0]
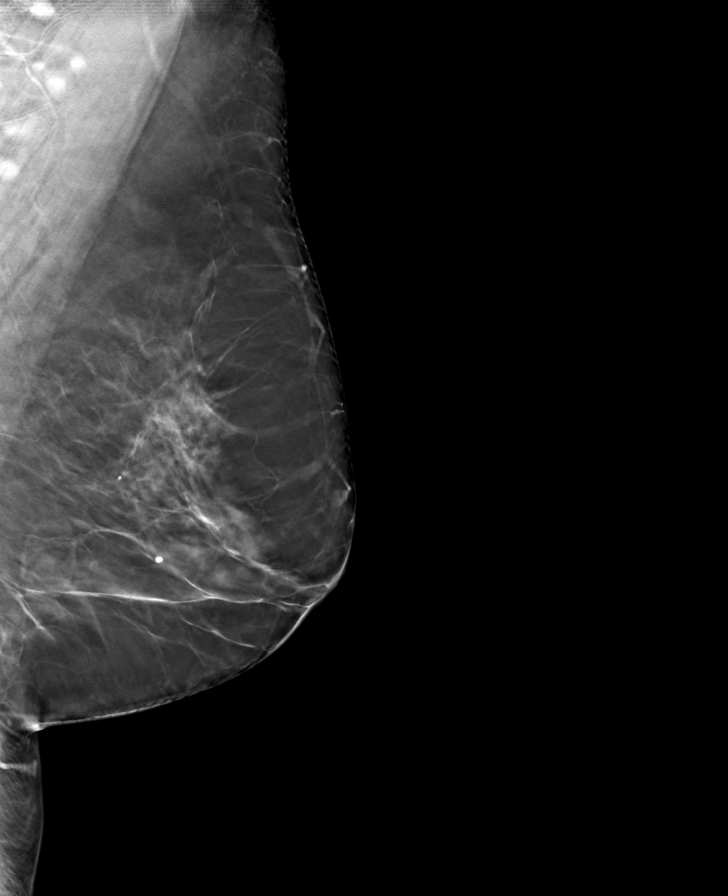

[8 of 24 positions shown; findings below may reference images not displayed]

ACR Breast Density Category b: There are scattered areas of
fibroglandular density.
FINDINGS: There are no findings suspicious for malignancy.
IMPRESSION: No mammographic evidence of malignancy. A result letter of this
screening mammogram will be mailed directly to the patient.

RECOMMENDATION:
Screening mammogram in one year. (Code:51-O-LD2)

BI-RADS CATEGORY  1: Negative.

## 2023-12-01 DIAGNOSIS — S92355A Nondisplaced fracture of fifth metatarsal bone, left foot, initial encounter for closed fracture: Secondary | ICD-10-CM | POA: Diagnosis not present

## 2023-12-01 DIAGNOSIS — M79672 Pain in left foot: Secondary | ICD-10-CM | POA: Diagnosis not present

## 2023-12-04 ENCOUNTER — Ambulatory Visit
Admission: RE | Admit: 2023-12-04 | Discharge: 2023-12-04 | Disposition: A | Discharge status: Home / Self Care | Payer: Medicare Other | Source: Ambulatory Visit | Attending: Orthopaedic Surgery | Admitting: Orthopaedic Surgery

## 2023-12-04 ENCOUNTER — Other Ambulatory Visit: Payer: Self-pay | Admitting: Orthopaedic Surgery

## 2023-12-04 DIAGNOSIS — S92355A Nondisplaced fracture of fifth metatarsal bone, left foot, initial encounter for closed fracture: Secondary | ICD-10-CM | POA: Diagnosis not present

## 2023-12-04 DIAGNOSIS — S92352D Displaced fracture of fifth metatarsal bone, left foot, subsequent encounter for fracture with routine healing: Secondary | ICD-10-CM | POA: Diagnosis not present

## 2023-12-04 DIAGNOSIS — S99922A Unspecified injury of left foot, initial encounter: Secondary | ICD-10-CM | POA: Diagnosis not present

## 2023-12-18 DIAGNOSIS — S92355A Nondisplaced fracture of fifth metatarsal bone, left foot, initial encounter for closed fracture: Secondary | ICD-10-CM | POA: Diagnosis not present

## 2024-01-29 DIAGNOSIS — S92355A Nondisplaced fracture of fifth metatarsal bone, left foot, initial encounter for closed fracture: Secondary | ICD-10-CM | POA: Diagnosis not present

## 2024-02-15 DIAGNOSIS — K080 Exfoliation of teeth due to systemic causes: Secondary | ICD-10-CM | POA: Diagnosis not present

## 2024-04-29 DIAGNOSIS — R399 Unspecified symptoms and signs involving the genitourinary system: Secondary | ICD-10-CM | POA: Diagnosis not present

## 2024-04-29 DIAGNOSIS — N390 Urinary tract infection, site not specified: Secondary | ICD-10-CM | POA: Diagnosis not present

## 2024-05-24 ENCOUNTER — Telehealth: Payer: Self-pay | Admitting: Cardiology

## 2024-05-24 NOTE — Telephone Encounter (Signed)
 Patient c/o Palpitations:  STAT if patient reporting lightheadedness, shortness of breath, or chest pain  How long have you had palpitations/irregular HR/ Afib? Are you having the symptoms now?   No  Are you currently experiencing lightheadedness, SOB or CP?   SOB   Do you have a history of afib (atrial fibrillation) or irregular heart rhythm?   Yes  Have you checked your BP or HR? (document readings if available):             Patient stated BP and HR reading have been fine  Are you experiencing any other symptoms?   Tiredness, slight cough   Patient stated the "flutters" and SOB is mostly at night when she is laying down.  Patient is concerned she may need a medication change.

## 2024-05-24 NOTE — Telephone Encounter (Signed)
 Patient reports about 3 weeks ago she began feeling exhausted. Then she noticed she had palpitations/flutters and SOB mainly at night when lying down. Patient states she was started on amlodipine  to help control her HR rather than her BP. She is currently taking amlodipine  2.5 mg daily.  Patient reports BP/HR this morning: 131/72, HR 106. She took her amlodipine  about 15 minutes ago. She denies any SOB or fluttering at time of call.  BP averages 120's/60's.  Patient would like to know what medication she can take to help with her symptoms.  No documented history of A-Fib or other arrhythmias seen in chart, but patient reports history of irregular HR.  Will forward to Dr. Audery Blazing to review and advise.

## 2024-05-26 NOTE — Telephone Encounter (Signed)
Spoke with pt, Aware of dr crenshaw's recommendations.  Follow up scheduled  

## 2024-06-03 ENCOUNTER — Encounter (HOSPITAL_COMMUNITY)
Admission: EM | Disposition: A | Discharge status: Home / Self Care | Payer: Self-pay | Source: Home / Self Care | Attending: Emergency Medicine

## 2024-06-03 ENCOUNTER — Inpatient Hospital Stay (HOSPITAL_COMMUNITY)

## 2024-06-03 ENCOUNTER — Emergency Department (HOSPITAL_COMMUNITY)

## 2024-06-03 ENCOUNTER — Observation Stay (HOSPITAL_COMMUNITY)
Admission: EM | Admit: 2024-06-03 | Discharge: 2024-06-04 | Disposition: A | Discharge status: Home / Self Care | Attending: Internal Medicine | Admitting: Internal Medicine

## 2024-06-03 ENCOUNTER — Other Ambulatory Visit (HOSPITAL_COMMUNITY): Payer: Self-pay

## 2024-06-03 ENCOUNTER — Other Ambulatory Visit: Payer: Self-pay

## 2024-06-03 ENCOUNTER — Telehealth (HOSPITAL_COMMUNITY): Payer: Self-pay | Admitting: Pharmacy Technician

## 2024-06-03 DIAGNOSIS — R079 Chest pain, unspecified: Secondary | ICD-10-CM | POA: Diagnosis not present

## 2024-06-03 DIAGNOSIS — R918 Other nonspecific abnormal finding of lung field: Secondary | ICD-10-CM | POA: Diagnosis not present

## 2024-06-03 DIAGNOSIS — I472 Ventricular tachycardia, unspecified: Secondary | ICD-10-CM | POA: Diagnosis not present

## 2024-06-03 DIAGNOSIS — R7989 Other specified abnormal findings of blood chemistry: Secondary | ICD-10-CM

## 2024-06-03 DIAGNOSIS — R0989 Other specified symptoms and signs involving the circulatory and respiratory systems: Secondary | ICD-10-CM | POA: Diagnosis not present

## 2024-06-03 DIAGNOSIS — R072 Precordial pain: Secondary | ICD-10-CM | POA: Diagnosis present

## 2024-06-03 DIAGNOSIS — I4891 Unspecified atrial fibrillation: Secondary | ICD-10-CM | POA: Diagnosis not present

## 2024-06-03 DIAGNOSIS — Z79899 Other long term (current) drug therapy: Secondary | ICD-10-CM | POA: Diagnosis not present

## 2024-06-03 DIAGNOSIS — I517 Cardiomegaly: Secondary | ICD-10-CM | POA: Diagnosis not present

## 2024-06-03 DIAGNOSIS — R000 Tachycardia, unspecified: Principal | ICD-10-CM

## 2024-06-03 DIAGNOSIS — R0602 Shortness of breath: Secondary | ICD-10-CM | POA: Diagnosis not present

## 2024-06-03 DIAGNOSIS — I509 Heart failure, unspecified: Secondary | ICD-10-CM | POA: Diagnosis not present

## 2024-06-03 DIAGNOSIS — I10 Essential (primary) hypertension: Secondary | ICD-10-CM | POA: Diagnosis present

## 2024-06-03 DIAGNOSIS — R0789 Other chest pain: Secondary | ICD-10-CM

## 2024-06-03 DIAGNOSIS — I499 Cardiac arrhythmia, unspecified: Secondary | ICD-10-CM | POA: Diagnosis not present

## 2024-06-03 DIAGNOSIS — R002 Palpitations: Secondary | ICD-10-CM | POA: Diagnosis not present

## 2024-06-03 DIAGNOSIS — I213 ST elevation (STEMI) myocardial infarction of unspecified site: Secondary | ICD-10-CM | POA: Diagnosis not present

## 2024-06-03 DIAGNOSIS — I110 Hypertensive heart disease with heart failure: Secondary | ICD-10-CM | POA: Diagnosis not present

## 2024-06-03 HISTORY — PX: LEFT HEART CATH AND CORONARY ANGIOGRAPHY: CATH118249

## 2024-06-03 LAB — CBC WITH DIFFERENTIAL/PLATELET
Abs Immature Granulocytes: 0.05 10*3/uL (ref 0.00–0.07)
Basophils Absolute: 0 10*3/uL (ref 0.0–0.1)
Basophils Relative: 0 %
Eosinophils Absolute: 0.1 10*3/uL (ref 0.0–0.5)
Eosinophils Relative: 0 %
HCT: 42.4 % (ref 36.0–46.0)
Hemoglobin: 13.3 g/dL (ref 12.0–15.0)
Immature Granulocytes: 0 %
Lymphocytes Relative: 29 %
Lymphs Abs: 3.8 10*3/uL (ref 0.7–4.0)
MCH: 31.7 pg (ref 26.0–34.0)
MCHC: 31.4 g/dL (ref 30.0–36.0)
MCV: 101.2 fL — ABNORMAL HIGH (ref 80.0–100.0)
Monocytes Absolute: 0.9 10*3/uL (ref 0.1–1.0)
Monocytes Relative: 7 %
Neutro Abs: 8.4 10*3/uL — ABNORMAL HIGH (ref 1.7–7.7)
Neutrophils Relative %: 64 %
Platelets: 283 10*3/uL (ref 150–400)
RBC: 4.19 MIL/uL (ref 3.87–5.11)
RDW: 12.2 % (ref 11.5–15.5)
WBC: 13.3 10*3/uL — ABNORMAL HIGH (ref 4.0–10.5)
nRBC: 0 % (ref 0.0–0.2)

## 2024-06-03 LAB — MAGNESIUM: Magnesium: 2.1 mg/dL (ref 1.7–2.4)

## 2024-06-03 LAB — TSH: TSH: 2.37 u[IU]/mL (ref 0.350–4.500)

## 2024-06-03 LAB — I-STAT CHEM 8, ED
BUN: 16 mg/dL (ref 8–23)
Calcium, Ion: 1.05 mmol/L — ABNORMAL LOW (ref 1.15–1.40)
Chloride: 107 mmol/L (ref 98–111)
Creatinine, Ser: 0.8 mg/dL (ref 0.44–1.00)
Glucose, Bld: 116 mg/dL — ABNORMAL HIGH (ref 70–99)
HCT: 40 % (ref 36.0–46.0)
Hemoglobin: 13.6 g/dL (ref 12.0–15.0)
Potassium: 4.2 mmol/L (ref 3.5–5.1)
Sodium: 138 mmol/L (ref 135–145)
TCO2: 22 mmol/L (ref 22–32)

## 2024-06-03 LAB — TROPONIN I (HIGH SENSITIVITY)
Troponin I (High Sensitivity): 20 ng/L — ABNORMAL HIGH (ref <18)
Troponin I (High Sensitivity): 27 ng/L — ABNORMAL HIGH (ref <18)

## 2024-06-03 LAB — COMPREHENSIVE METABOLIC PANEL WITH GFR
ALT: 22 U/L (ref 0–44)
AST: 21 U/L (ref 15–41)
Albumin: 3.5 g/dL (ref 3.5–5.0)
Alkaline Phosphatase: 72 U/L (ref 38–126)
Anion gap: 13 (ref 5–15)
BUN: 14 mg/dL (ref 8–23)
CO2: 19 mmol/L — ABNORMAL LOW (ref 22–32)
Calcium: 8.9 mg/dL (ref 8.9–10.3)
Chloride: 108 mmol/L (ref 98–111)
Creatinine, Ser: 0.77 mg/dL (ref 0.44–1.00)
GFR, Estimated: >60 mL/min (ref >60)
Glucose, Bld: 115 mg/dL — ABNORMAL HIGH (ref 70–99)
Potassium: 4.2 mmol/L (ref 3.5–5.1)
Sodium: 140 mmol/L (ref 135–145)
Total Bilirubin: 0.3 mg/dL (ref 0.0–1.2)
Total Protein: 5.9 g/dL — ABNORMAL LOW (ref 6.5–8.1)

## 2024-06-03 LAB — D-DIMER, QUANTITATIVE: D-Dimer, Quant: <0.27 ug{FEU}/mL (ref 0.00–0.50)

## 2024-06-03 LAB — HEPARIN LEVEL (UNFRACTIONATED): Heparin Unfractionated: 0.44 [IU]/mL (ref 0.30–0.70)

## 2024-06-03 LAB — BRAIN NATRIURETIC PEPTIDE: B Natriuretic Peptide: 276.3 pg/mL — ABNORMAL HIGH (ref 0.0–100.0)

## 2024-06-03 LAB — ECHOCARDIOGRAM COMPLETE
Height: 64 in
Single Plane A2C EF: 34.8 %
Weight: 2432 [oz_av]

## 2024-06-03 MED ORDER — DILTIAZEM LOAD VIA INFUSION
15.0000 mg | Freq: Once | INTRAVENOUS | Status: AC
Start: 2024-06-03 — End: 2024-06-03
  Administered 2024-06-03: 15 mg via INTRAVENOUS

## 2024-06-03 MED ORDER — LIDOCAINE HCL (PF) 1 % IJ SOLN
INTRAMUSCULAR | Status: DC | PRN
  Administered 2024-06-03: 2 mL

## 2024-06-03 MED ORDER — FENTANYL CITRATE (PF) 100 MCG/2ML IJ SOLN
INTRAMUSCULAR | Status: DC | PRN
  Administered 2024-06-03: 25 ug via INTRAVENOUS

## 2024-06-03 MED ORDER — AMIODARONE HCL IN DEXTROSE 360-4.14 MG/200ML-% IV SOLN: 30.0000 mg/h | INTRAVENOUS | Status: DC

## 2024-06-03 MED ORDER — VERAPAMIL HCL 2.5 MG/ML IV SOLN
INTRAVENOUS | Status: AC
  Filled 2024-06-03: qty 2

## 2024-06-03 MED ORDER — AMIODARONE HCL IN DEXTROSE 360-4.14 MG/200ML-% IV SOLN
30.0000 mg/h | INTRAVENOUS | Status: DC
  Administered 2024-06-03: 30 mg/h via INTRAVENOUS
  Filled 2024-06-03: qty 200

## 2024-06-03 MED ORDER — VERAPAMIL HCL 2.5 MG/ML IV SOLN
INTRAVENOUS | Status: DC | PRN
  Administered 2024-06-03: 10 mL via INTRA_ARTERIAL

## 2024-06-03 MED ORDER — LIDOCAINE HCL (PF) 1 % IJ SOLN
INTRAMUSCULAR | Status: AC
  Filled 2024-06-03: qty 30

## 2024-06-03 MED ORDER — SODIUM CHLORIDE 0.9 % IV SOLN: INTRAVENOUS | Status: DC

## 2024-06-03 MED ORDER — ACETAMINOPHEN 325 MG PO TABS: 650.0000 mg | ORAL_TABLET | ORAL | Status: DC | PRN

## 2024-06-03 MED ORDER — AMIODARONE HCL IN DEXTROSE 360-4.14 MG/200ML-% IV SOLN
60.0000 mg/h | INTRAVENOUS | Status: AC
  Administered 2024-06-03: 60 mg/h via INTRAVENOUS
  Filled 2024-06-03: qty 200

## 2024-06-03 MED ORDER — AMIODARONE HCL IN DEXTROSE 360-4.14 MG/200ML-% IV SOLN
60.0000 mg/h | INTRAVENOUS | Status: DC
  Administered 2024-06-03: 60 mg/h via INTRAVENOUS
  Filled 2024-06-03: qty 200

## 2024-06-03 MED ORDER — ATORVASTATIN CALCIUM 10 MG PO TABS
10.0000 mg | ORAL_TABLET | Freq: Every day | ORAL | Status: DC
  Administered 2024-06-03: 10 mg via ORAL
  Filled 2024-06-03: qty 1

## 2024-06-03 MED ORDER — ONDANSETRON HCL 4 MG/2ML IJ SOLN: 4.0000 mg | Freq: Four times a day (QID) | INTRAMUSCULAR | Status: DC | PRN

## 2024-06-03 MED ORDER — MIDAZOLAM HCL 2 MG/2ML IJ SOLN
INTRAMUSCULAR | Status: AC
  Filled 2024-06-03: qty 2

## 2024-06-03 MED ORDER — KETAMINE HCL 50 MG/5ML IJ SOSY
PREFILLED_SYRINGE | INTRAMUSCULAR | Status: AC
  Administered 2024-06-03: 34 mg via INTRAVENOUS
  Filled 2024-06-03: qty 5

## 2024-06-03 MED ORDER — APIXABAN 5 MG PO TABS
5.0000 mg | ORAL_TABLET | Freq: Two times a day (BID) | ORAL | Status: DC
  Administered 2024-06-03 – 2024-06-04 (×2): 5 mg via ORAL
  Filled 2024-06-03 (×2): qty 1

## 2024-06-03 MED ORDER — FENTANYL CITRATE (PF) 100 MCG/2ML IJ SOLN
INTRAMUSCULAR | Status: AC
  Filled 2024-06-03: qty 2

## 2024-06-03 MED ORDER — KETAMINE HCL 50 MG/5ML IJ SOSY: 0.5000 mg/kg | PREFILLED_SYRINGE | Freq: Once | INTRAMUSCULAR | Status: AC

## 2024-06-03 MED ORDER — NITROGLYCERIN 0.4 MG SL SUBL: 0.4000 mg | SUBLINGUAL_TABLET | SUBLINGUAL | Status: DC | PRN

## 2024-06-03 MED ORDER — ADENOSINE 6 MG/2ML IV SOLN
INTRAVENOUS | Status: AC
  Filled 2024-06-03: qty 4

## 2024-06-03 MED ORDER — AMIODARONE LOAD VIA INFUSION
150.0000 mg | Freq: Once | INTRAVENOUS | Status: AC
  Administered 2024-06-03: 150 mg via INTRAVENOUS
  Filled 2024-06-03: qty 83.34

## 2024-06-03 MED ORDER — HEPARIN (PORCINE) IN NACL 1000-0.9 UT/500ML-% IV SOLN
INTRAVENOUS | Status: DC | PRN
  Administered 2024-06-03 (×2): 500 mL

## 2024-06-03 MED ORDER — ASPIRIN 300 MG RE SUPP: 300.0000 mg | RECTAL | Status: AC

## 2024-06-03 MED ORDER — DILTIAZEM HCL-DEXTROSE 125-5 MG/125ML-% IV SOLN (PREMIX)
INTRAVENOUS | Status: AC
  Filled 2024-06-03: qty 125

## 2024-06-03 MED ORDER — HEPARIN BOLUS VIA INFUSION
3400.0000 [IU] | Freq: Once | INTRAVENOUS | Status: AC
  Administered 2024-06-03: 3400 [IU] via INTRAVENOUS
  Filled 2024-06-03: qty 3400

## 2024-06-03 MED ORDER — HEPARIN SODIUM (PORCINE) 1000 UNIT/ML IJ SOLN
INTRAMUSCULAR | Status: AC
  Filled 2024-06-03: qty 10

## 2024-06-03 MED ORDER — PERFLUTREN LIPID MICROSPHERE
1.0000 mL | INTRAVENOUS | Status: AC | PRN
  Administered 2024-06-03: 2 mL via INTRAVENOUS

## 2024-06-03 MED ORDER — SODIUM CHLORIDE 0.9 % IV SOLN: INTRAVENOUS | Status: AC

## 2024-06-03 MED ORDER — IOHEXOL 350 MG/ML SOLN
INTRAVENOUS | Status: DC | PRN
  Administered 2024-06-03: 20 mL

## 2024-06-03 MED ORDER — ASPIRIN 81 MG PO CHEW
324.0000 mg | CHEWABLE_TABLET | ORAL | Status: AC
  Administered 2024-06-03: 324 mg via ORAL
  Filled 2024-06-03: qty 4

## 2024-06-03 MED ORDER — HYDRALAZINE HCL 20 MG/ML IJ SOLN: 10.0000 mg | INTRAMUSCULAR | Status: AC | PRN

## 2024-06-03 MED ORDER — ADENOSINE 6 MG/2ML IV SOLN
12.0000 mg | Freq: Once | INTRAVENOUS | Status: AC
  Administered 2024-06-03: 12 mg via INTRAVENOUS

## 2024-06-03 MED ORDER — MIDAZOLAM HCL 2 MG/2ML IJ SOLN
INTRAMUSCULAR | Status: DC | PRN
  Administered 2024-06-03: 1 mg via INTRAVENOUS

## 2024-06-03 MED ORDER — ADENOSINE 6 MG/2ML IV SOLN
INTRAVENOUS | Status: AC | PRN
  Administered 2024-06-03: 12 mg via INTRAVENOUS

## 2024-06-03 MED ORDER — SODIUM CHLORIDE 0.9 % IV BOLUS
1000.0000 mL | Freq: Once | INTRAVENOUS | Status: AC
  Administered 2024-06-03: 1000 mL via INTRAVENOUS

## 2024-06-03 MED ORDER — HEPARIN SODIUM (PORCINE) 1000 UNIT/ML IJ SOLN
INTRAMUSCULAR | Status: DC | PRN
  Administered 2024-06-03: 3500 [IU] via INTRAVENOUS

## 2024-06-03 MED ORDER — FUROSEMIDE 10 MG/ML IJ SOLN
20.0000 mg | Freq: Once | INTRAMUSCULAR | Status: AC
  Administered 2024-06-03: 20 mg via INTRAVENOUS
  Filled 2024-06-03: qty 2

## 2024-06-03 MED ORDER — ZOLPIDEM TARTRATE 5 MG PO TABS
5.0000 mg | ORAL_TABLET | Freq: Every day | ORAL | Status: DC
  Administered 2024-06-03: 5 mg via ORAL
  Filled 2024-06-03: qty 1

## 2024-06-03 MED ORDER — HEPARIN (PORCINE) 25000 UT/250ML-% IV SOLN
950.0000 [IU]/h | INTRAVENOUS | Status: DC
  Administered 2024-06-03: 950 [IU]/h via INTRAVENOUS
  Filled 2024-06-03: qty 250

## 2024-06-03 MED ORDER — ASPIRIN 81 MG PO TBEC
81.0000 mg | DELAYED_RELEASE_TABLET | Freq: Every day | ORAL | Status: DC
  Administered 2024-06-04: 81 mg via ORAL
  Filled 2024-06-03: qty 1

## 2024-06-03 NOTE — Progress Notes (Signed)
 Patient transferred to the cath lab for procedure.  Patient was placed on zoll on arrival.  Consent was drafted.  Souse's name and phone # provided for post procedure update.  Patient needed to speak with the physician.  Amiodarone drip was at 60mg /hr.  I asked the staff in the room to verify if this should continue at this rate or if it should be decreased to 30mg /hr.  Heparin drip was stopped on arrival of the staff to pickup the patient.

## 2024-06-03 NOTE — Progress Notes (Signed)
 Informed Consent   Shared Decision Making/Informed Consent The risks [stroke (1 in 1000), death (1 in 1000), kidney failure [usually temporary] (1 in 500), bleeding (1 in 200), allergic reaction [possibly serious] (1 in 200)], benefits (diagnostic support and management of coronary artery disease) and alternatives of a cardiac catheterization were discussed in detail with Jeanne Wilson and she is willing to proceed.    Debria Fang, PA-C 06/03/2024 12:29 PM

## 2024-06-03 NOTE — Interval H&P Note (Signed)
 History and Physical Interval Note:  06/03/2024 5:52 PM  Jeanne Wilson  has presented today for surgery, with the diagnosis of nstemi.  The various methods of treatment have been discussed with the patient and family. After consideration of risks, benefits and other options for treatment, the patient has consented to  Procedure(s): LEFT HEART CATH AND CORONARY ANGIOGRAPHY (N/A) as a surgical intervention.  The patient's history has been reviewed, patient examined, no change in status, stable for surgery.  I have reviewed the patient's chart and labs.  Questions were answered to the patient's satisfaction.     Aigner Horseman J Janeli Lewison

## 2024-06-03 NOTE — ED Notes (Signed)
Pt placed on zoll pads 

## 2024-06-03 NOTE — Progress Notes (Signed)
 PHARMACY - ANTICOAGULATION CONSULT NOTE  Pharmacy Consult for heparin Indication: atrial fibrillation  Allergies  Allergen Reactions   Azithromycin Nausea Only   Codeine Hives    Patient Measurements: Height: 5\' 4"  (162.6 cm) Weight: 68.9 kg (152 lb) IBW/kg (Calculated) : 54.7 HEPARIN DW (KG): 68.5  Vital Signs: Temp: 97.6 F (36.4 C) (06/06 1147) Temp Source: Temporal (06/06 1147) BP: 113/68 (06/06 1634) Pulse Rate: 80 (06/06 1634)  Labs: Recent Labs    06/03/24 0603 06/03/24 0621 06/03/24 0803 06/03/24 1650  HGB 13.3 13.6  --   --   HCT 42.4 40.0  --   --   PLT 283  --   --   --   HEPARINUNFRC  --   --   --  0.44  CREATININE 0.77 0.80  --   --   TROPONINIHS 20*  --  27*  --     Estimated Creatinine Clearance: 62.4 mL/min (by C-G formula based on SCr of 0.8 mg/dL).   Medical History: Past Medical History:  Diagnosis Date   Chest pain    with abnormal EKG. cardiac cath 12/06- normal coronaries, normal EF. cath 10/10- normal coronaries and EF   HTN (hypertension)    Kidney stone    Kidney stones     Medications:  Scheduled:   [START ON 06/04/2024] aspirin EC  81 mg Oral Daily   atorvastatin  10 mg Oral QHS   zolpidem  5 mg Oral QHS   Infusions:   sodium chloride     amiodarone     heparin 950 Units/hr (06/03/24 0724)    Assessment: 73 yo patient presenting with palpitations and SOB found to be in wide complex tachycardia. Patient was cardioverted to atrial fibrillation, no history of afib. Not on anticoagulation PTA. Pharmacy has been consulted to dose heparin in the setting of new afib. Hgb 13.6, plt 283.   Goal of Therapy:  Heparin level 0.3-0.7 units/ml Monitor platelets by anticoagulation protocol: Yes   Plan:  Continue heparin infusion at 950 units/hr Check heparin level in 8 hours with AM labs and daily while on heparin Continue to monitor H&H and platelets  Thank you for allowing pharmacy to be a part of this patient's care.  Claudia Cuff, PharmD, BCPS Clinical Pharmacist

## 2024-06-03 NOTE — ED Provider Notes (Signed)
  Physical Exam  BP (!) 155/98   Pulse 95   Temp 97.7 F (36.5 C) (Temporal)   Resp 17   Ht 1.626 m (5\' 4" )   Wt 68.9 kg   SpO2 99%   BMI 26.09 kg/m   Physical Exam  Procedures  Procedures  ED Course / MDM    Medical Decision Making Amount and/or Complexity of Data Reviewed Labs: ordered. Radiology: ordered.  Risk Prescription drug management.   73 yo female presented with cp, sob, and palpitations, received cardizem , dilt , and then cardioverted with 150 J Patient now on heparin , pain and dyspnea improved. Cardiology coming to see.       Auston Blush, MD 06/05/24 [phone]

## 2024-06-03 NOTE — H&P (View-Only) (Signed)
 Informed Consent   Shared Decision Making/Informed Consent The risks [stroke (1 in 1000), death (1 in 1000), kidney failure [usually temporary] (1 in 500), bleeding (1 in 200), allergic reaction [possibly serious] (1 in 200)], benefits (diagnostic support and management of coronary artery disease) and alternatives of a cardiac catheterization were discussed in detail with Jeanne Wilson and she is willing to proceed.    Debria Fang, PA-C 06/03/2024 12:29 PM

## 2024-06-03 NOTE — H&P (Signed)
 Cardiology Admission History and Physical   Patient ID: KIONDRA CAICEDO MRN: [id number]; DOB: 1951/12/25   Admission date: 06/03/2024  PCP:  Darnelle Elders, PA-C   [facility] Providers Cardiologist:  Alexandria Angel, MD    Chief Complaint:  Chest pain, palpitations   Patient Profile: Jeanne Wilson is a 73 y.o. female with a past medical history of chest discomfort with normal cardiac catheterization in 2006, HTN, HLD who is being seen 06/03/2024 for the evaluation of new onset atrial fibrillation.  History of Present Illness: Jeanne Wilson is a 73 year old female with above medical history who is followed by Dr. Audery Blazing. Per chart review, patient has a history of non-cardiac chest discomfort. Previously had a cardiac catheterization in 2006 that showed normal coronary arteries. Her most recent echocardiogram from 12/2021 showed EF 60%, no regional wall motion abnormalities, normal RV systolic function, no significant valvular abnormalities. She was last seen by Friddie Jetty NP in 05/2023. At that time, patient was doing well. Denited chest pain. BP was well controlled and she was on lipitor for HLD   Patient called the cardiology office on 5/27 complaining of palpitations. Reported feeling a "fluttering" feeling in her chest. Also had some shortness of breath at night when laying down and she had a slight cough. She was scheduled for an outpatient appointment, but this has not yet occurred.   She presented to the ED on 06/03/24 complaining of centralized chest pain that radiated to her right arm and neck. Associated with some SOB and palpitations. EMS was called and she was found to be tachycardic with HR in the 180s. Gave adenosine 6 mg without rhythm changes.   In the ED, initial vital signs showed HR 181, BP 137/91, oxygen 98% on room air. EKG showed a wide-complex, regular tachycardia with HR 181 Bpm. Patient was given 2 doses of IV adenosine 12 mg but did not convert. She was given  15 mg IV diltiazem without improvement. Attempted IV amiodarone, again without improvement. Patient became hypotensive, lightheaded and dizzy. Decision was made to cardiovert.   Patient cardioverted successfully to atrial fibrillation, which is a new diagnosis. She was started on IV heparin. While in Afib, her HR was overall well controlled in the 80s-90s.  Labs in the ED showed BNP elevated to 276. hsTn 20, repeat pending. TSH within normal limits. K 4.2, mag 2.1, creatinine 0.77. WBC slightly elevated to 13.3. CXR showed cardiomegaly and pulmonary vascular congestion, left lower lung opacification compatible with pleural effusion.   On interview, patient reports that she had been doing well from a cardiac standpoint up until March of this year. In March, she started to have episodes of palpitations/fluttering in her chest. Episodes were usually mild and brief, so she did not worry too much about them. She continued to have episodes of palpitations intermittently. In May, she noticed that she was much more fatigued than usual. She also noticed that she was getting short of breath on exertion and felt like she "could not get a full breath". She also started to have orthopnea and needed a wedge pillow to sleep. She had a mild, dry cough. No lower extremity swelling, abdominal distention, or weight gain. She called our office and scheduled an appointment, which has not yet occurred.   Last night, she woke up from sleep and went to the kitchen to grab a snack. She sat down to eat, and started to have significant pounding in her chest. She also felt diaphoretic, nauseous.  She went on to develop chest pain that radiated down her right arm. She took a nitroglycerin  tablet, and the pain eased off a bit. She continued to have chest pressure and an elevated HR so she called EMS. After being cardioverted in the ED, her HR has improved to the 80s-90s. She is feeling much better and no longer has chest discomfort.     Past Medical History:  Diagnosis Date   Chest pain    with abnormal EKG. cardiac cath 12/06- normal coronaries, normal EF. 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 Authorizing Provider  amLODipine  (NORVASC ) 2.5 MG tablet Take 1 tablet (2.5 mg total) by mouth daily. 06/24/22   Acharya, Gayatri A, MD  atorvastatin (LIPITOR) 10 MG tablet Take 10 mg by mouth daily. 08/17/22   [provider]  CALCIUM-MAGNESIUM-ZINC PO Take 1,500 capsules by mouth daily.    [provider]  ibandronate (BONIVA) 150 MG tablet Take 150 mg by mouth every 30 (thirty) days. Take in the morning with a full glass of water, on an empty stomach, and do not take anything else by mouth or lie down for the next 30 min.     [provider]  Loratadine (CLARITIN PO) Take by mouth daily.    [provider]  Meth-Hyo-M Bl-Na Phos-Ph Sal (URIBEL) 118 MG CAPS Take 1 capsule by mouth as needed.    [provider]  nitroGLYCERIN  (NITROSTAT ) 0.4 MG SL tablet Place 1 tablet (0.4 mg total) under the tongue every 5 (five) minutes as needed. 03/06/23   Tania Familia, NP  valACYclovir (VALTREX) 1000 MG tablet Take 1,000 mg by mouth as needed.    [provider]  zolpidem (AMBIEN) 5 MG tablet Take 5 mg by mouth at bedtime.  09/06/14   [provider]     Allergies:    Allergies  Allergen Reactions   Codeine Hives    Social History:   Social History   Socioeconomic History   Marital status: Married    Spouse name: Not on file   Number of children: Not on file   Years of education: Not on file   Highest education level: Not on file  Occupational History   Not on file  Tobacco Use    Smoking status: Never   Smokeless tobacco: Never  Substance and Sexual Activity   Alcohol use: No   Drug use: No   Sexual activity: Not on file  Other Topics Concern   Not on file  Social History Narrative   Married, homemaker. Is active in yoga and low-impact aerobics.    Social Drivers of Corporate investment banker Strain: Not on file  Food Insecurity: Not on file  Transportation Needs: Not on file  Physical Activity: Not on file  Stress: Not on file  Social Connections: Not on file  Intimate Partner Violence: Not on file     Family History:   The patient's family history includes Hypertension in her mother.    ROS:  Please see the history of present illness.  All other ROS reviewed and negative.     Physical Exam/Data: Vitals:  06/03/24 0700 06/03/24 0701 06/03/24 0900 06/03/24 1000  BP: (!) 155/98  112/65 112/74  Pulse: 90 95 76 80  Resp: (!) 23 17 (!) 25 16  Temp:      TempSrc:      SpO2: 100% 99% 100% 98%  Weight:      Height:        Intake/Output Summary (Last 24 hours) at 06/03/2024 1058 Last data filed at 06/03/2024 0900 Gross per 24 hour  Intake 1000 ml  Output 900 ml  Net 100 ml      06/03/2024    6:02 AM 06/15/2023    8:01 AM 03/11/2023   12:59 PM  Last 3 Weights  Weight (lbs) 152 lb 154 lb 6.4 oz 154 lb  Weight (kg) 68.947 kg 70.035 kg 69.854 kg     Body mass index is 26.09 kg/m.  General:  Well nourished, well developed, in no acute distress. Laying in the bed with head elevated  HEENT: normal Neck: no JVD Vascular: No carotid bruits; Radial pulses 2+ bilaterally   Cardiac:  normal S1, S2; irregular rate and rhythm  Lungs: crackles in bilateral lung bases, otherwise clear. Normal WOB on [place]  Abd: soft, nontender  Ext: no  edema in BLE  Musculoskeletal:  No deformities  Skin: warm and dry  Neuro:  no focal abnormalities noted Psych:  Normal affect   EKG:  The ECG that was done on arrival to the ED was personally reviewed and  demonstrates a regular, wide-complex tachycardia with HR 181 Bpm. EKG after cardioversion shows atrial fibrillation, wide-spread ST depression, HR 87 BPM   Relevant CV Studies:  Laboratory Data: High Sensitivity Troponin:   Recent Labs  Lab 06/03/24 0603 06/03/24 0803  TROPONINIHS 20* 27*      Chemistry Recent Labs  Lab 06/03/24 0603 06/03/24 0621  NA 140 138  K 4.2 4.2  CL 108 107  CO2 19*  --   GLUCOSE 115* 116*  BUN 14 16  CREATININE 0.77 0.80  CALCIUM 8.9  --   MG 2.1  --   GFRNONAA >60  --   ANIONGAP 13  --     Recent Labs  Lab 06/03/24 0603  PROT 5.9*  ALBUMIN 3.5  AST 21  ALT 22  ALKPHOS 72  BILITOT 0.3   Lipids No results for input(s): "CHOL", "TRIG", "HDL", "LABVLDL", "LDLCALC", "CHOLHDL" in the last 168 hours. Hematology Recent Labs  Lab 06/03/24 0603 06/03/24 0621  WBC 13.3*  --   RBC 4.19  --   HGB 13.3 13.6  HCT 42.4 40.0  MCV 101.2*  --   MCH 31.7  --   MCHC 31.4  --   RDW 12.2  --   PLT 283  --    Thyroid   Recent Labs  Lab 06/03/24 0603  TSH 2.370   BNP Recent Labs  Lab 06/03/24 0601  BNP 276.3*    DDimer  Recent Labs  Lab 06/03/24 0603  DDIMER <0.27    Radiology/Studies:  DG Chest Portable 1 View Result Date: 06/03/2024 CLINICAL DATA:  Shortness of breath. EXAM: PORTABLE CHEST 1 VIEW COMPARISON:  01/24/18. FINDINGS: There is mild cardiac enlargement. Veil like opacification over the left lower lung with blunting of the left costophrenic angle. Pulmonary vascular congestion noted. Scar versus subsegmental atelectasis in the right base. IMPRESSION: 1. Cardiomegaly and pulmonary vascular congestion. 2. Left lower lung opacification compatible with pleural effusion and atelectasis. Electronically Signed   By: Burtis Case.D.  On: 06/03/2024 06:53     Assessment and Plan:  Atrial Fibrillation, newly diagnosed  Wide-Complex Tachycardia  - Patient reports having episodes of palpitations/fluttering in her chest for the  past 3 months. Her HR at home has been in the upper 90s-100s. Possible that patient has been in/out of afib since 02/2024 based on her symptoms.  - Overnight, she developed significant palpitations, chest pain, nausea, diaphoresis. Found to have HR in the 180s with EMS - Initial EKG in the ED showed a regular, wide-complex tachycardia with HR 181 BPM. Possible VT vs SVT. Failed to slow after IV adenosine 6 mg, 12mg , 12 mg. Also did not slow with IV dilt or IV amiodarone. Ultimately needed emergent cardioversion and went into afib with well controlled HR  - Currently remains in afib with HR in the 80s-90s. Now asymptomatic  - TSH within normal limits. K 4.2, mag 2.1  - Consulting EP to evaluate wide-complex tachycardia and concern for VT. HsTn 20-27. EKG after cardioversion showed diffuse ST depression. May need cath this admission - Ordered echocardiogram  - Currently on IV heparin. Continue  - Restart IV amiodarone drip to prevent recurrence of wide-complex tachycardia   CHF, EF unknown  - EF previously 60% in 2023  - In addition to episodes of palpitations, patient has also been having shortness of breath on exertion, cough, and orthopnea.  - BNP elevated to 276. CXR with cardiomegaly and pulmonary vascular congestion  - Ordered echocardiogram  - Patient received IV lasix 20 mg in the ED- so far has put out 0.9 L urine. Appears euvolemic on exam, no more lasix at this time   Chest Pain  - When HR was elevated to the 180s, patient had chest pain that radiated to her right arm. Felt like pressure - Now that her HR is well controlled, she denies chest pain  - hsTn 20>27 - EKG after cardioversion showed wide-spread ST depression. Possible this is related to her prolonged episode of tachycardia. Ordered repeat EKG  - Echo pending  - As above, considering cath this admission pending EP consult   Risk Assessment/Risk Scores:      [place]  Heart Association (NYHA) Functional Class NYHA Class  II  CHA2DS2-VASc Score = 4  This indicates a 4.8% annual risk of stroke. The patient's score is based upon: CHF History: 1 HTN History: 1 Diabetes History: 0 Stroke History: 0 Vascular Disease History: 0 Age Score: 1 Gender Score: 1   Code Status: Full Code  Severity of Illness: The appropriate patient status for this patient is INPATIENT. Inpatient status is judged to be reasonable and necessary in order to provide the required intensity of service to ensure the patient's safety. The patient's presenting symptoms, physical exam findings, and initial radiographic and laboratory data in the context of their chronic comorbidities is felt to place them at high risk for further clinical deterioration. Furthermore, it is not anticipated that the patient will be medically stable for discharge from the hospital within 2 midnights of admission.   * I certify that at the point of admission it is my clinical judgment that the patient will require inpatient hospital care spanning beyond 2 midnights from the point of admission due to high intensity of service, high risk for further deterioration and high frequency of surveillance required.*  For questions or updates, please contact [facility] Please consult [email or web address] for contact info under     Signed, Debria Fang, PA-C  06/03/2024 10:58 AM

## 2024-06-03 NOTE — ED Provider Notes (Signed)
  EMERGENCY DEPARTMENT AT [facility] Provider Note   CSN: [id number] Arrival date & time: 06/03/24  4098     History  Chief Complaint  Patient presents with   Chest Pain   Tachycardia    SVT    Jeanne Wilson is a 73 y.o. female.  Patient from home with palpitations, chest pain, shortness of breath that woke her from sleep about 2 AM.  States she felt well going to bed but woke up about 2 AM with pressure in her chest, palpitations, shortness of breath and pain going to her right arm.  EMS reports wide complex tachycardia as high as 180.  Patient was given aspirin and nitroglycerin  x 4 with some relief.  No history of CAD or stents.  States she does have mitral valve prolapse and takes amlodipine  for history of palpitations but no diagnosis of previous arrhythmias including atrial flutter or atrial fibrillation. Instructed EMS to try adenosine x 1 which stated without success.  Patient arrives with wide-complex tachycardia in the 180s but feels her chest pressure has improved Reports she had palpitations several weeks ago and had called her cardiologist who is canceled her for an appointment the next several weeks but does not make any medication changes  The history is provided by the patient and the EMS personnel. The history is limited by the condition of the patient.  Chest Pain Associated symptoms: shortness of breath   Associated symptoms: no abdominal pain, no dizziness, no fever, no headache, no nausea, no vomiting and no weakness        Home Medications Prior to Admission medications   Medication Sig Start Date End Date Taking? Authorizing Provider  amLODipine  (NORVASC ) 2.5 MG tablet Take 1 tablet (2.5 mg total) by mouth daily. 06/24/22   Acharya, Gayatri A, MD  atorvastatin (LIPITOR) 10 MG tablet Take 10 mg by mouth daily. 08/17/22   [provider]  CALCIUM-MAGNESIUM-ZINC PO Take 1,500 capsules by mouth daily.    [provider]   ibandronate (BONIVA) 150 MG tablet Take 150 mg by mouth every 30 (thirty) days. Take in the morning with a full glass of water, on an empty stomach, and do not take anything else by mouth or lie down for the next 30 min.     [provider]  Loratadine (CLARITIN PO) Take by mouth daily.    [provider]  Meth-Hyo-M Bl-Na Phos-Ph Sal (URIBEL) 118 MG CAPS Take 1 capsule by mouth as needed.    [provider]  nitroGLYCERIN  (NITROSTAT ) 0.4 MG SL tablet Place 1 tablet (0.4 mg total) under the tongue every 5 (five) minutes as needed. 03/06/23   Tania Familia, NP  valACYclovir (VALTREX) 1000 MG tablet Take 1,000 mg by mouth as needed.    [provider]  zolpidem (AMBIEN) 5 MG tablet Take 5 mg by mouth at bedtime.  09/06/14   [provider]      Allergies    Codeine    Review of Systems   Review of Systems  Constitutional:  Negative for activity change, appetite change and fever.  HENT:  Negative for congestion and rhinorrhea.   Respiratory:  Positive for chest tightness and shortness of breath.   Cardiovascular:  Positive for chest pain.  Gastrointestinal:  Negative for abdominal pain, nausea and vomiting.  Genitourinary:  Negative for dysuria and hematuria.  Musculoskeletal:  Negative for arthralgias and myalgias.  Skin:  Negative for rash.  Neurological:  Negative for  dizziness, weakness and headaches.    all other systems are negative except as noted in the HPI and PMH.   Physical Exam Updated Vital Signs BP 103/84   Pulse (!) 181   Temp (!) 97.4 F (36.3 C) (Temporal)   Resp 17   Ht 5\' 4"  (1.626 m)   Wt 68.9 kg   SpO2 94%   BMI 26.09 kg/m  Physical Exam Vitals and nursing note reviewed.  Constitutional:      General: She is not in acute distress.    Appearance: She is well-developed.  HENT:     Head: Normocephalic and atraumatic.     Mouth/Throat:     Pharynx: No oropharyngeal exudate.  Eyes:     Conjunctiva/sclera:  Conjunctivae normal.     Pupils: Pupils are equal, round, and reactive to light.  Neck:     Comments: No meningismus. Cardiovascular:     Rate and Rhythm: Regular rhythm. Tachycardia present.     Heart sounds: Normal heart sounds. No murmur heard.    Comments: Regular wide-complex tachycardia 186. Pulmonary:     Effort: Pulmonary effort is normal. No respiratory distress.     Breath sounds: Normal breath sounds.  Abdominal:     Palpations: Abdomen is soft.     Tenderness: There is no abdominal tenderness. There is no guarding or rebound.  Musculoskeletal:        General: No tenderness. Normal range of motion.     Cervical back: Normal range of motion and neck supple.  Skin:    General: Skin is warm.  Neurological:     Mental Status: She is alert and oriented to person, place, and time.     Cranial Nerves: No cranial nerve deficit.     Motor: No abnormal muscle tone.     Coordination: Coordination normal.     Comments:  5/5 strength throughout. CN 2-12 intact.Equal grip strength.   Psychiatric:        Behavior: Behavior normal.     ED Results / Procedures / Treatments   Labs (all labs ordered are listed, but only abnormal results are displayed) Labs Reviewed  CBC WITH DIFFERENTIAL/PLATELET - Abnormal; Notable for the following components:      Result Value   WBC 13.3 (*)    MCV 101.2 (*)    Neutro Abs 8.4 (*)    All other components within normal limits  COMPREHENSIVE METABOLIC PANEL WITH GFR - Abnormal; Notable for the following components:   CO2 19 (*)    Glucose, Bld 115 (*)    Total Protein 5.9 (*)    All other components within normal limits  BRAIN NATRIURETIC PEPTIDE - Abnormal; Notable for the following components:   B Natriuretic Peptide 276.3 (*)    All other components within normal limits  I-STAT CHEM 8, ED - Abnormal; Notable for the following components:   Glucose, Bld 116 (*)    Calcium, Ion 1.05 (*)    All other components within normal limits   TROPONIN I (HIGH SENSITIVITY) - Abnormal; Notable for the following components:   Troponin I (High Sensitivity) 20 (*)    All other components within normal limits  D-DIMER, QUANTITATIVE  MAGNESIUM  TSH  HEPARIN LEVEL (UNFRACTIONATED)    EKG EKG Interpretation Date/Time:  Friday June 03 2024 06:09:44 EDT Ventricular Rate:  185 PR Interval:  128 QRS Duration:  141 QT Interval:  308 QTC Calculation: 541 R Axis:   107  Text Interpretation: Extreme tachycardia with wide  complex, no further rhythm analysis attempted SVT with aberrancy ? Confirmed by Earma Gloss [phone]) on 06/03/2024 6:11:44 AM  Radiology DG Chest Portable 1 View Result Date: 06/03/2024 CLINICAL DATA:  Shortness of breath. EXAM: PORTABLE CHEST 1 VIEW COMPARISON:  01/24/18. FINDINGS: There is mild cardiac enlargement. Veil like opacification over the left lower lung with blunting of the left costophrenic angle. Pulmonary vascular congestion noted. Scar versus subsegmental atelectasis in the right base. IMPRESSION: 1. Cardiomegaly and pulmonary vascular congestion. 2. Left lower lung opacification compatible with pleural effusion and atelectasis. Electronically Signed   By: Kimberley Penman M.D.   On: 06/03/2024 06:53    Procedures .Critical Care  Performed by: Earma Gloss, MD Authorized by: Earma Gloss, MD   Critical care provider statement:    Critical care time (minutes):  45   Critical care time was exclusive of:  Separately billable procedures and treating other patients   Critical care was necessary to treat or prevent imminent or life-threatening deterioration of the following conditions:  Cardiac failure (wide complex tachycardia)   Critical care was time spent personally by me on the following activities:  Development of treatment plan with patient or surrogate, discussions with consultants, evaluation of patient's response to treatment, examination of patient, ordering and review of laboratory studies,  ordering and review of radiographic studies, ordering and performing treatments and interventions, pulse oximetry, re-evaluation of patient's condition, review of old charts, blood draw for specimens and obtaining history from patient or surrogate   I assumed direction of critical care for this patient from another provider in my specialty: no     Care discussed with: admitting provider   .Sedation  Date/Time: 06/03/2024 7:00 AM  Performed by: Earma Gloss, MD Authorized by: Earma Gloss, MD   Consent:    Consent obtained:  Verbal and emergent situation   Consent given by:  Patient   Risks discussed:  Allergic reaction, dysrhythmia, inadequate sedation, nausea, prolonged hypoxia resulting in organ damage, prolonged sedation necessitating reversal, respiratory compromise necessitating ventilatory assistance and intubation and vomiting   Alternatives discussed:  Analgesia without sedation, anxiolysis and regional anesthesia Universal protocol:    Procedure explained and questions answered to patient or proxy's satisfaction: yes     Relevant documents present and verified: yes     Test results available: yes     Imaging studies available: yes     Required blood products, implants, devices, and special equipment available: yes     Site/side marked: yes     Immediately prior to procedure, a time out was called: yes     Patient identity confirmed:  Verbally with patient Indications:    Procedure performed:  Cardioversion   Procedure necessitating sedation performed by:  Physician performing sedation Pre-sedation assessment:    Time since last food or drink:  8   ASA classification: class 1 - normal, healthy patient     Mouth opening:  3 or more finger widths   Thyromental distance:  4 finger widths   Mallampati score:  I - soft palate, uvula, fauces, pillars visible   Neck mobility: normal     Pre-sedation assessments completed and reviewed: airway patency, cardiovascular function,  hydration status, mental status, nausea/vomiting, pain level, respiratory function and temperature   A pre-sedation assessment was completed prior to the start of the procedure Immediate pre-procedure details:    Reassessment: Patient reassessed immediately prior to procedure     Reviewed: vital signs, relevant labs/tests and NPO status     Verified:  bag valve mask available, emergency equipment available, intubation equipment available, IV patency confirmed, oxygen available and suction available   Procedure details (see MAR for exact dosages):    Preoxygenation:  Nasal cannula   Sedation:  Ketamine   Intended level of sedation: deep   Intra-procedure monitoring:  Blood pressure monitoring, cardiac monitor, continuous pulse oximetry, frequent LOC assessments, frequent vital sign checks and continuous capnometry   Intra-procedure events: none     Total Provider sedation time (minutes):  10 Post-procedure details:   A post-sedation assessment was completed following the completion of the procedure.   Attendance: Constant attendance by certified staff until patient recovered     Recovery: Patient returned to pre-procedure baseline     Post-sedation assessments completed and reviewed: airway patency, cardiovascular function, hydration status, mental status, nausea/vomiting, pain level, respiratory function and temperature     Patient is stable for discharge or admission: yes     Procedure completion:  Tolerated well, no immediate complications .Cardioversion  Date/Time: 06/03/2024 7:00 AM  Performed by: Earma Gloss, MD Authorized by: Earma Gloss, MD   Consent:    Consent obtained:  Emergent situation and verbal   Consent given by:  Patient   Risks discussed:  Cutaneous burn, death and pain   Alternatives discussed:  No treatment, rate-control medication and alternative treatment Pre-procedure details:    Cardioversion basis:  Emergent   Rhythm:  Supraventricular tachycardia    Electrode placement:  Anterior-posterior Patient sedated: Yes. Refer to sedation procedure documentation for details of sedation.  Attempt one:    Cardioversion mode:  Synchronous   Waveform:  Biphasic   Shock (Joules):  150   Cardioversion outcome attempt one: change to atrial fibrillation. Comments:     Cardioversion attempted chemically with adenosine x 2 without success.  Also gave 150 Cardizem without success.  Remains wide-complex tachycardia in the 180s.  Becoming dizzy, lightheaded and nauseous and decision made to cardiovert emergently.      Medications Ordered in ED Medications  amiodarone (NEXTERONE) 1.8 mg/mL load via infusion 150 mg (150 mg Intravenous Bolus from Bag 06/03/24 0616)    Followed by  amiodarone (NEXTERONE PREMIX) 360-4.14 MG/200ML-% (1.8 mg/mL) IV infusion (60 mg/hr Intravenous New Bag/Given 06/03/24 0616)    Followed by  amiodarone (NEXTERONE PREMIX) 360-4.14 MG/200ML-% (1.8 mg/mL) IV infusion (has no administration in time range)  adenosine (ADENOCARD) 6 MG/2ML injection (12 mg Intravenous Given 06/03/24 0608)  adenosine (ADENOCARD) 6 MG/2ML injection 12 mg (0 mg Intravenous Hold 06/03/24 0613)  diltiazem (CARDIZEM) 1 mg/mL load via infusion 15 mg (0 mg Intravenous Hold 06/03/24 0609)    ED Course/ Medical Decision Making/ A&P                                 Medical Decision Making Amount and/or Complexity of Data Reviewed Independent Historian: EMS Labs: ordered. Decision-making details documented in ED Course. Radiology: ordered and independent interpretation performed. Decision-making details documented in ED Course. ECG/medicine tests: ordered and independent interpretation performed. Decision-making details documented in ED Course.  Risk Prescription drug management.   Palpitations, shortness of breath, chest pain.  Stable blood pressure and mental status on arrival.  Attempted additional adenosine without success. Initial EKG with wide-complex  tachycardia in the 180s.  Concern for SVT with aberrancy, atrial flutter with a Baron C, ventricular tachycardia.  Blood pressure and mental status are maintained.  Patient given IV Cardizem without success.  Will  attempt amiodarone.  Ejection fraction last echocardiogram was normal.  Cardioversion not successful with Cardizem or adenosine as above.  Amiodarone was initiated but patient became hypotensive, lightheaded and dizzy and a decision was made to cardiovert emergently.  Discussed with Dr. Maximo Spar of cardiology who agrees.  Patient cardioverted successfully to atrial fibrillation which she does not have a history of.  Does show global ischemia similar to previous with elevation in aVR.  Will initiate IV heparin. EKG with stable ST depressions inferiorly with elevation in aVR.  She denies chest pain  Labs show leukocytosis.  Troponin minimally elevated at 20.  Will trend.  Potassium and magnesium are normal. chest x-ray with small pleural effusions and vascular congestion.  IV Lasix given.  No hypoxia or increased work of breathing.  Patient feels improved with cardioversion to atrial fibrillation with rate well-controlled.  ST depressions similar to previous with elevation in aVR.  Discussed with cardiology Dr. Maximo Spar who will evaluate. Continue IV heparin.  Amiodarone held as she is rate controlled in the 80s and 90s currently. Awaiting cardiology evaluation at shift change.  Dr. Synetta Eves aware of patient.       Final Clinical Impression(s) / ED Diagnoses Final diagnoses:  Wide-complex tachycardia    Rx / DC Orders ED Discharge Orders     None         Mihira Tozzi, Mara [place], MD 06/03/24 [phone]

## 2024-06-03 NOTE — Progress Notes (Addendum)
 PHARMACY - ANTICOAGULATION CONSULT NOTE  Pharmacy Consult for heparin >> apixaban Indication: atrial fibrillation  Allergies  Allergen Reactions   Azithromycin Nausea Only   Codeine Hives    Patient Measurements: Height: 5\' 4"  (162.6 cm) Weight: 68.9 kg (152 lb) IBW/kg (Calculated) : 54.7 HEPARIN DW (KG): 68.5  Vital Signs: Temp: 97.6 F (36.4 C) (06/06 1147) Temp Source: Temporal (06/06 1147) BP: 109/67 (06/06 1825) Pulse Rate: 80 (06/06 1830)  Labs: Recent Labs    06/03/24 0603 06/03/24 0621 06/03/24 0803 06/03/24 1650  HGB 13.3 13.6  --   --   HCT 42.4 40.0  --   --   PLT 283  --   --   --   HEPARINUNFRC  --   --   --  0.44  CREATININE 0.77 0.80  --   --   TROPONINIHS 20*  --  27*  --     Estimated Creatinine Clearance: 62.4 mL/min (by C-G formula based on SCr of 0.8 mg/dL).   Medical History: Past Medical History:  Diagnosis Date   Chest pain    with abnormal EKG. cardiac cath 12/06- normal coronaries, normal EF. cath 10/10- normal coronaries and EF   HTN (hypertension)    Kidney stone    Kidney stones      Assessment: 73 yo patient presenting with palpitations and SOB found to be in wide complex tachycardia. Patient was cardioverted to atrial fibrillation, no history of afib. Not on anticoagulation PTA. Pharmacy has been consulted for apixaban. Pt is s/p cath and ok to start apixaban tonight.   Goal of Therapy:  Heparin level 0.3-0.7 units/ml Monitor platelets by anticoagulation protocol: Yes   Plan:  Stop heparin Start apixaban 5mg  BID  Monitor signs/symptoms of bleeding   Thank you for allowing pharmacy to be a part of this patient's care.  Dorene Gang, PharmD, BCPS, BCCP Clinical Pharmacist  Please check AMION for all [facility] Pharmacy phone numbers After 10:00 PM, call Main Pharmacy [phone]

## 2024-06-03 NOTE — ED Notes (Signed)
 Two doses of 12 mg of Adenosine given IV (see mar) and 15 mg Cardizem given IV with no change of cardiac rhythm. Pt remains in SVT. Continues to c/o chest pain.

## 2024-06-03 NOTE — ED Notes (Signed)
 Pt BP dropped to 82/67 with repeat BP 84/73. Pt c/o lightheaded and nausea. EDP informed. EDP synchronized cardioverted pt at 150 J with conversion to a fib HR 70s. EKG obtained. Following cardioversion pt denied CP and lightheaded.

## 2024-06-03 NOTE — Progress Notes (Signed)
 PHARMACY - ANTICOAGULATION CONSULT NOTE  Pharmacy Consult for heparin Indication: atrial fibrillation  Allergies  Allergen Reactions   Codeine Hives    Patient Measurements: Height: 5\' 4"  (162.6 cm) Weight: 68.9 kg (152 lb) IBW/kg (Calculated) : 54.7 HEPARIN DW (KG): 68.5  Vital Signs: Temp: 97.7 F (36.5 C) (06/06 0644) Temp Source: Temporal (06/06 0644) BP: 155/98 (06/06 0700) Pulse Rate: 95 (06/06 0701)  Labs: Recent Labs    06/03/24 0603 06/03/24 0621  HGB 13.3 13.6  HCT 42.4 40.0  PLT 283  --   CREATININE 0.77 0.80  TROPONINIHS 20*  --     Estimated Creatinine Clearance: 62.4 mL/min (by C-G formula based on SCr of 0.8 mg/dL).   Medical History: Past Medical History:  Diagnosis Date   Chest pain    with abnormal EKG. cardiac cath 12/06- normal coronaries, normal EF. cath 10/10- normal coronaries and EF   HTN (hypertension)    Kidney stone    Kidney stones     Medications:  Scheduled:  Infusions:   amiodarone Stopped (06/03/24 8119)   Followed by   amiodarone      Assessment: 73 yo patient presenting with palpitations and SOB found to be in wide complex tachycardia. Patient was cardioverted to atrial fibrillation, no history of afib. Not on anticoagulation PTA. Pharmacy has been consulted to dose heparin in the setting of new afib. Hgb 13.6, plt 283.   Goal of Therapy:  Heparin level 0.3-0.7 units/ml Monitor platelets by anticoagulation protocol: Yes   Plan:  Heparin 3400 units x 1 bolus  Then, start heparin 950 units/hr  8 hour heparin level  Daily heparin level and CBC while on heparin  Monitor for s/sx of bleeding   Rafaela Dinius I Broderic Bara 06/03/2024,7:06 AM

## 2024-06-03 NOTE — Telephone Encounter (Signed)
 Patient Product/process development scientist completed.    The patient is insured through [facility]. Patient has Medicare and is not eligible for a copay card, but may be able to apply for patient assistance or Medicare RX Payment Plan (Patient Must reach out to their plan, if eligible for payment plan), if available.    Ran test claim for Eliquis 5 mg and the current 30 day co-pay is $45.00.  Ran test claim for Xarelto 20 mg and the current 30 day co-pay is $45.00.  This test claim was processed through [facility]- copay amounts may vary at other pharmacies due to pharmacy/plan contracts, or as the patient moves through the different stages of their insurance plan.     Roland Earl, CPHT Pharmacy Technician III Certified Patient Advocate [facility] Patient Advocate Team Direct Number: [phone]  Fax: [phone]

## 2024-06-03 NOTE — Discharge Instructions (Addendum)
 Information on my medicine - ELIQUIS (apixaban)  This medication education was reviewed with me or my healthcare representative as part of my discharge preparation.     Why was Eliquis prescribed for you? Eliquis was prescribed for you to reduce the risk of a blood clot forming that can cause a stroke if you have a medical condition called atrial fibrillation (a type of irregular heartbeat).  What do You need to know about Eliquis ? Take your Eliquis TWICE DAILY - one tablet in the morning and one tablet in the evening with or without food. If you have difficulty swallowing the tablet whole please discuss with your pharmacist how to take the medication safely.  Take Eliquis exactly as prescribed by your doctor and DO NOT stop taking Eliquis without talking to the doctor who prescribed the medication.  Stopping may increase your risk of developing a stroke.  Refill your prescription before you run out.  After discharge, you should have regular check-up appointments with your healthcare provider that is prescribing your Eliquis.  In the future your dose may need to be changed if your kidney function or weight changes by a significant amount or as you get older.  What do you do if you miss a dose? If you miss a dose, take it as soon as you remember on the same day and resume taking twice daily.  Do not take more than one dose of ELIQUIS at the same time to make up a missed dose.  Important Safety Information A possible side effect of Eliquis is bleeding. You should call your healthcare provider right away if you experience any of the following: Bleeding from an injury or your nose that does not stop. Unusual colored urine (red or dark brown) or unusual colored stools (red or black). Unusual bruising for unknown reasons. A serious fall or if you hit your head (even if there is no bleeding).  Some medicines may interact with Eliquis and might increase your risk of bleeding or clotting  while on Eliquis. To help avoid this, consult your healthcare provider or pharmacist prior to using any new prescription or non-prescription medications, including herbals, vitamins, non-steroidal anti-inflammatory drugs (NSAIDs) and supplements.  This website has more information on Eliquis (apixaban): http://www.eliquis.com/eliquis/home =======================================  Atrial Fibrillation    Atrial fibrillation is a type of heartbeat that is irregular or fast. If you have this condition, your heart beats without any order. This makes it hard for your heart to pump blood in a normal way. Atrial fibrillation may come and go, or it may become a long-lasting problem. If this condition is not treated, it can put you at higher risk for stroke, heart failure, and other heart problems.  What are the causes? This condition may be caused by diseases that damage the heart. They include: High blood pressure. Heart failure. Heart valve disease. Heart surgery. Other causes include: Diabetes. Thyroid disease. Being overweight. Kidney disease. Sometimes the cause is not known.  What increases the risk? You are more likely to develop this condition if: You are older. You smoke. You exercise often and very hard. You have a family history of this condition. You are a man. You use drugs. You drink a lot of alcohol. You have lung conditions, such as emphysema, pneumonia, or COPD. You have sleep apnea.  What are the signs or symptoms? Common symptoms of this condition include: A feeling that your heart is beating very fast. Chest pain or discomfort. Feeling short of breath. Suddenly feeling  light-headed or weak. Getting tired easily during activity. Fainting. Sweating. In some cases, there are no symptoms.  How is this treated? Treatment for this condition depends on underlying conditions and how you feel when you have atrial fibrillation. They include: Medicines to: Prevent  blood clots. Treat heart rate or heart rhythm problems. Using devices, such as a pacemaker, to correct heart rhythm problems. Doing surgery to remove the part of the heart that sends bad signals. Closing an area where clots can form in the heart (left atrial appendage). In some cases, your doctor will treat other underlying conditions.  Follow these instructions at home:  Medicines Take over-the-counter and prescription medicines only as told by your doctor. Do not take any new medicines without first talking to your doctor. If you are taking blood thinners: Talk with your doctor before you take any medicines that have aspirin or NSAIDs, such as ibuprofen, in them. Take your medicine exactly as told by your doctor. Take it at the same time each day. Avoid activities that could hurt or bruise you. Follow instructions about how to prevent falls. Wear a bracelet that says you are taking blood thinners. Or, carry a card that lists what medicines you take. Lifestyle         Do not use any products that have nicotine or tobacco in them. These include cigarettes, e-cigarettes, and chewing tobacco. If you need help quitting, ask your doctor. Eat heart-healthy foods. Talk with your doctor about the right eating plan for you. Exercise regularly as told by your doctor. Do not drink alcohol. Lose weight if you are overweight. Do not use drugs, including cannabis.  General instructions If you have a condition that causes breathing to stop for a short period of time (apnea), treat it as told by your doctor. Keep a healthy weight. Do not use diet pills unless your doctor says they are safe for you. Diet pills may make heart problems worse. Keep all follow-up visits as told by your doctor. This is important.  Contact a doctor if: You notice a change in the speed, rhythm, or strength of your heartbeat. You are taking a blood-thinning medicine and you get more bruising. You get tired more easily  when you move or exercise. You have a sudden change in weight.  Get help right away if:    You have pain in your chest or your belly (abdomen). You have trouble breathing. You have side effects of blood thinners, such as blood in your vomit, poop (stool), or pee (urine), or bleeding that cannot stop. You have any signs of a stroke. "BE FAST" is an easy way to remember the main warning signs: B - Balance. Signs are dizziness, sudden trouble walking, or loss of balance. E - Eyes. Signs are trouble seeing or a change in how you see. F - Face. Signs are sudden weakness or loss of feeling in the face, or the face or eyelid drooping on one side. A - Arms. Signs are weakness or loss of feeling in an arm. This happens suddenly and usually on one side of the body. S - Speech. Signs are sudden trouble speaking, slurred speech, or trouble understanding what people say. T - Time. Time to call emergency services. Write down what time symptoms started. You have other signs of a stroke, such as: A sudden, very bad headache with no known cause. Feeling like you may vomit (nausea). Vomiting. A seizure.  These symptoms may be an emergency. Do not wait to  see if the symptoms will go away. Get medical help right away. Call your local emergency services (911 in the [place] Do not drive yourself to the hospital. Summary Atrial fibrillation is a type of heartbeat that is irregular or fast. You are at higher risk of this condition if you smoke, are older, have diabetes, or are overweight. Follow your doctor's instructions about medicines, diet, exercise, and follow-up visits. Get help right away if you have signs or symptoms of a stroke. Get help right away if you cannot catch your breath, or you have chest pain or discomfort. This information is not intended to replace advice given to you by your health care provider. Make sure you discuss any questions you have with your health care provider. Document  Revised: 06/08/2019 Document Reviewed: 06/08/2019 Elsevier Patient Education  2020 [employer].

## 2024-06-03 NOTE — Progress Notes (Signed)
 HPI: FU atrial fibrillation. Stress test 2010 with question of lateral wall ischemia; cath showed normal cors and normal EF. Echocardiogram June 2025 showed ejection fraction 45 to 50%, mild left ventricular enlargement, mild left atrial enlargement, mild mitral regurgitation.  Cardiac catheterization June 2025 showed normal coronary arteries.  Patient was admitted June 2025 with wide-complex tachycardia.  It was felt this was likely secondary to atrial fibrillation with aberrancy.  She did undergo cardioversion due to hypotension.  She was treated with amiodarone .  Since last seen, patient has some fatigue and mild dyspnea but no chest pain, palpitations or syncope.  No pedal edema.  No bleeding.  Current Outpatient Medications  Medication Sig Dispense Refill   amiodarone  (PACERONE ) 200 MG tablet Take amiodarone  200 mg (1 tablet) twice daily for 2 weeks, then go down to 200 mg (1 tablet) daily thereafter 60 tablet 2   apixaban  (ELIQUIS ) 5 MG TABS tablet Take 1 tablet (5 mg total) by mouth 2 (two) times daily. 60 tablet 11   atorvastatin  (LIPITOR) 10 MG tablet Take 10 mg by mouth every morning.     CALCIUM -MAGNESIUM-ZINC PO Take 1 capsule by mouth every morning.     carvedilol  (COREG ) 3.125 MG tablet Take 1 tablet (3.125 mg total) by mouth 2 (two) times daily with a meal. 60 tablet 11   Glucosamine HCl (GLUCOSAMINE PO) Take 2 tablets by mouth every morning.     ibandronate (BONIVA) 150 MG tablet Take 150 mg by mouth every 30 (thirty) days. Take in the morning with a full glass of water, on an empty stomach, and do not take anything else by mouth or lie down for the next 30 min.      losartan  (COZAAR ) 25 MG tablet Take 1 tablet (25 mg total) by mouth daily. 90 tablet 1   nitroGLYCERIN  (NITROSTAT ) 0.4 MG SL tablet Place 1 tablet (0.4 mg total) under the tongue every 5 (five) minutes as needed. 25 tablet 2   zolpidem  (AMBIEN ) 5 MG tablet Take 5 mg by mouth at bedtime.      No current  facility-administered medications for this visit.     Past Medical History:  Diagnosis Date   Chest pain    with abnormal EKG. cardiac cath 12/06- normal coronaries, normal EF. 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   LEFT HEART CATH AND CORONARY ANGIOGRAPHY N/A 06/03/2024   Procedure: LEFT HEART CATH AND CORONARY ANGIOGRAPHY;  Surgeon: Cody Das, MD;  Location: [facility];  Service: Cardiovascular;  Laterality: N/A;    Social History   Socioeconomic History   Marital status: Married    Spouse name: Not on file   Number of children: Not on file   Years of education: Not on file   Highest education level: Not on file  Occupational History   Not on file  Tobacco Use   Smoking status: Never   Smokeless tobacco: Never  Substance and Sexual Activity   Alcohol use: No   Drug use: No   Sexual activity: Not on file  Other Topics Concern   Not on file  Social History Narrative   Married, homemaker. Is active in yoga and low-impact aerobics.    Social Drivers of Dispensing optician  Resource Strain: Not on file  Food Insecurity: No Food Insecurity (06/03/2024)   Hunger Vital Sign    Worried About Running Out of Food in the Last Year: Never true    Ran Out of Food in the Last Year: Never true  Transportation Needs: No Transportation Needs (06/03/2024)   PRAPARE - Administrator, Civil Service (Medical): No    Lack of Transportation (Non-Medical): No  Physical Activity: Not on file  Stress: Not on file  Social Connections: Socially Integrated (06/03/2024)   Social Connection and Isolation Panel    Frequency of Communication with Friends and Family: Twice a week    Frequency of Social Gatherings with Friends and Family: Twice a week    Attends Religious Services: 1 to 4 times per  year    Active Member of [employer] or Organizations: No    Attends Engineer, structural: 1 to 4 times per year    Marital Status: Married  Catering manager Violence: Not At Risk (06/03/2024)   Humiliation, Afraid, Rape, and Kick questionnaire    Fear of Current or Ex-Partner: No    Emotionally Abused: No    Physically Abused: No    Sexually Abused: No    Family History  Problem Relation Age of Onset   Hypertension Mother     ROS: no fevers or chills, productive cough, hemoptysis, dysphasia, odynophagia, melena, hematochezia, dysuria, hematuria, rash, seizure activity, orthopnea, PND, pedal edema, claudication. Remaining systems are negative.  Physical Exam: Well-developed well-nourished in no acute distress.  Skin is warm and dry.  HEENT is normal.  Neck is supple.  Chest is clear to auscultation with normal expansion.  Cardiovascular exam is irregular Abdominal exam nontender or distended. No masses palpated. Extremities show no edema. neuro grossly intact  EKG Interpretation Date/Time:  Thursday June 16 2024 08:38:29 EDT Ventricular Rate:  84 PR Interval:    QRS Duration:  94 QT Interval:  394 QTC Calculation: 465 R Axis:   -21  Text Interpretation: Atrial fibrillation Left ventricular hypertrophy with repolarization abnormality ( R in aVL , Cornell product ) Cannot rule out Septal infarct Confirmed by Alexandria Angel ([id number]) on 06/16/2024 8:47:39 AM    A/P  1 persistent atrial fibrillation-patient recently admitted with wide-complex tachycardia requiring emergent cardioversion.  This was felt likely atrial fibrillation with aberrancy.  She remains in rate controlled atrial fibrillation today.  Continue amiodarone  (decreased to 200 mg daily in 3 days) and apixaban .  Plan will be to proceed with cardioversion in approximately 4 weeks (I would like for her to have more amiodarone  in her system at time of cardioversion).  Check lipids, liver and TSH in 6 months.  2  cardiomyopathy-mildly reduced LV function on most recent echocardiogram.  Question tachycardia mediated.  Will plan repeat echocardiogram 3 months after sinus is reestablished to see if LV function has improved now that sinus has been reestablished.  3 hypertension-blood pressure controlled.  Continue present medications.  4 history of chest pain-Recent catheterization revealed no coronary disease.  5 hyperlipidemia-continue statin.  Alexandria Angel, MD

## 2024-06-03 NOTE — ED Triage Notes (Signed)
 Pt BIB [facility] from home woken up from her sleep d/t sudden centralized chest pain that radiated to her right arm and neck. Associated with some SOB and palpitations. Found to be in SVT HR 180s. Given 6 adenosine with no rhythm changes.   Arrives with 18L AC. Given NTG x4, 324 ASA, and 500 IVF PTA

## 2024-06-04 ENCOUNTER — Other Ambulatory Visit (HOSPITAL_COMMUNITY): Payer: Self-pay

## 2024-06-04 ENCOUNTER — Other Ambulatory Visit: Payer: Self-pay | Admitting: Cardiology

## 2024-06-04 ENCOUNTER — Encounter (HOSPITAL_COMMUNITY): Payer: Self-pay | Admitting: Internal Medicine

## 2024-06-04 DIAGNOSIS — I4891 Unspecified atrial fibrillation: Secondary | ICD-10-CM | POA: Diagnosis not present

## 2024-06-04 DIAGNOSIS — I480 Paroxysmal atrial fibrillation: Secondary | ICD-10-CM

## 2024-06-04 DIAGNOSIS — I4819 Other persistent atrial fibrillation: Secondary | ICD-10-CM

## 2024-06-04 DIAGNOSIS — I509 Heart failure, unspecified: Secondary | ICD-10-CM | POA: Diagnosis not present

## 2024-06-04 DIAGNOSIS — I110 Hypertensive heart disease with heart failure: Secondary | ICD-10-CM | POA: Diagnosis not present

## 2024-06-04 DIAGNOSIS — Z79899 Other long term (current) drug therapy: Secondary | ICD-10-CM | POA: Diagnosis not present

## 2024-06-04 DIAGNOSIS — R9431 Abnormal electrocardiogram [ECG] [EKG]: Secondary | ICD-10-CM

## 2024-06-04 LAB — CBC
HCT: 39.1 % (ref 36.0–46.0)
Hemoglobin: 12.6 g/dL (ref 12.0–15.0)
MCH: 31.3 pg (ref 26.0–34.0)
MCHC: 32.2 g/dL (ref 30.0–36.0)
MCV: 97 fL (ref 80.0–100.0)
Platelets: 231 10*3/uL (ref 150–400)
RBC: 4.03 MIL/uL (ref 3.87–5.11)
RDW: 12.3 % (ref 11.5–15.5)
WBC: 7.3 10*3/uL (ref 4.0–10.5)
nRBC: 0 % (ref 0.0–0.2)

## 2024-06-04 LAB — BASIC METABOLIC PANEL WITH GFR
Anion gap: 8 (ref 5–15)
BUN: 10 mg/dL (ref 8–23)
CO2: 22 mmol/L (ref 22–32)
Calcium: 8.6 mg/dL — ABNORMAL LOW (ref 8.9–10.3)
Chloride: 106 mmol/L (ref 98–111)
Creatinine, Ser: 0.77 mg/dL (ref 0.44–1.00)
GFR, Estimated: >60 mL/min (ref >60)
Glucose, Bld: 108 mg/dL — ABNORMAL HIGH (ref 70–99)
Potassium: 3.6 mmol/L (ref 3.5–5.1)
Sodium: 136 mmol/L (ref 135–145)

## 2024-06-04 LAB — LIPID PANEL
Cholesterol: 129 mg/dL (ref 0–200)
HDL: 39 mg/dL — ABNORMAL LOW (ref >40)
LDL Cholesterol: 58 mg/dL (ref 0–99)
Total CHOL/HDL Ratio: 3.3 ratio
Triglycerides: 160 mg/dL — ABNORMAL HIGH (ref <150)
VLDL: 32 mg/dL (ref 0–40)

## 2024-06-04 MED ORDER — LOSARTAN POTASSIUM 25 MG PO TABS
25.0000 mg | ORAL_TABLET | Freq: Every day | ORAL | Status: DC
  Administered 2024-06-04: 25 mg via ORAL
  Filled 2024-06-04: qty 1

## 2024-06-04 MED ORDER — CARVEDILOL 3.125 MG PO TABS
3.1250 mg | ORAL_TABLET | Freq: Two times a day (BID) | ORAL | 11 refills | Status: DC
Start: 2024-06-04 — End: 2024-06-04

## 2024-06-04 MED ORDER — APIXABAN 5 MG PO TABS
5.0000 mg | ORAL_TABLET | Freq: Two times a day (BID) | ORAL | 11 refills | Status: AC
  Filled 2024-06-04 – 2024-07-04 (×2): qty 60, 30d supply, fill #0
  Filled 2024-08-03: qty 60, 30d supply, fill #1
  Filled 2024-09-05: qty 60, 30d supply, fill #2
  Filled 2024-10-06: qty 60, 30d supply, fill #3
  Filled 2024-11-08: qty 60, 30d supply, fill #4
  Filled 2024-12-07: qty 60, 30d supply, fill #5
  Filled 2025-01-10: qty 60, 30d supply, fill #6

## 2024-06-04 MED ORDER — AMIODARONE HCL 200 MG PO TABS: ORAL_TABLET | ORAL | 2 refills | Status: DC

## 2024-06-04 MED ORDER — AMIODARONE HCL 200 MG PO TABS
ORAL_TABLET | ORAL | 2 refills | Status: DC
  Filled 2024-06-04: qty 60, 30d supply, fill #0
  Filled 2024-07-04: qty 60, 60d supply, fill #0
  Filled 2024-09-05: qty 60, 60d supply, fill #1

## 2024-06-04 MED ORDER — CARVEDILOL 3.125 MG PO TABS
3.1250 mg | ORAL_TABLET | Freq: Two times a day (BID) | ORAL | 11 refills | Status: AC
  Filled 2024-06-04 – 2024-07-04 (×2): qty 60, 30d supply, fill #0
  Filled 2024-08-03: qty 60, 30d supply, fill #1
  Filled 2024-09-05: qty 60, 30d supply, fill #2
  Filled 2024-10-06: qty 60, 30d supply, fill #3
  Filled 2024-11-08: qty 60, 30d supply, fill #4
  Filled 2024-12-07: qty 60, 30d supply, fill #5
  Filled 2025-01-10: qty 60, 30d supply, fill #6

## 2024-06-04 MED ORDER — CARVEDILOL 3.125 MG PO TABS: 3.1250 mg | ORAL_TABLET | Freq: Two times a day (BID) | ORAL | 11 refills | Status: DC

## 2024-06-04 MED ORDER — LOSARTAN POTASSIUM 25 MG PO TABS
25.0000 mg | ORAL_TABLET | Freq: Every day | ORAL | 1 refills | Status: DC
Start: 2024-06-05 — End: 2024-06-04

## 2024-06-04 MED ORDER — LOSARTAN POTASSIUM 25 MG PO TABS
25.0000 mg | ORAL_TABLET | Freq: Every day | ORAL | 1 refills | Status: DC
Start: 2024-06-05 — End: 2024-11-10

## 2024-06-04 MED ORDER — AMIODARONE HCL 200 MG PO TABS
200.0000 mg | ORAL_TABLET | Freq: Two times a day (BID) | ORAL | Status: DC
  Administered 2024-06-04: 200 mg via ORAL
  Filled 2024-06-04: qty 1

## 2024-06-04 MED ORDER — AMIODARONE HCL 200 MG PO TABS
ORAL_TABLET | ORAL | 2 refills | Status: DC
Start: 2024-06-04 — End: 2024-06-04

## 2024-06-04 MED ORDER — APIXABAN 5 MG PO TABS
5.0000 mg | ORAL_TABLET | Freq: Two times a day (BID) | ORAL | 11 refills | Status: DC
Start: 2024-06-04 — End: 2024-06-04

## 2024-06-04 MED ORDER — CARVEDILOL 3.125 MG PO TABS: 3.1250 mg | ORAL_TABLET | Freq: Two times a day (BID) | ORAL | Status: DC

## 2024-06-04 MED ORDER — APIXABAN 5 MG PO TABS: 5.0000 mg | ORAL_TABLET | Freq: Two times a day (BID) | ORAL | 11 refills | Status: DC

## 2024-06-04 NOTE — Discharge Summary (Addendum)
 Discharge Summary   Jeanne Wilson ID: Jeanne Wilson MRN: [id number]; DOB: July 13, 1951  Admit date: 06/03/2024 Discharge date: 06/04/2024  PCP:  Jeanne Elders, PA-C   [facility] Providers Cardiologist:  Jeanne Angel, MD       Discharge Diagnoses  Principal Problem:   A-fib [facility]) Active Problems:   HYPERTENSION, BENIGN   CHEST PAIN-PRECORDIAL   Diagnostic Studies/Procedures    Echo 06/03/2024  1. Left ventricular ejection fraction, by estimation, is 45 to 50%. The  left ventricle has mildly decreased function. The left ventricle  demonstrates global hypokinesis. The left ventricular internal cavity size  was mildly dilated. Left ventricular  diastolic parameters were normal.   2. Right ventricular systolic function is normal. The right ventricular  size is normal.   3. Left atrial size was mildly dilated.   4. The mitral valve is abnormal. Mild mitral valve regurgitation. No  evidence of mitral stenosis.   5. The aortic valve is tricuspid. There is mild calcification of the  aortic valve. There is mild thickening of the aortic valve. Aortic valve  regurgitation is not visualized. Aortic valve sclerosis is present, with  no evidence of aortic valve stenosis.   6. The inferior vena cava is normal in size with greater than 50%  respiratory variability, suggesting right atrial pressure of 3 mmHg.    Cath 06/03/2024 Coronary angiography 06/03/2024: LM: Normal LAD: Normal, no significant disease Lcx: Dominant, no significant disease RCA: Non-dominant, no significant disease   LVEDP 8 mmHg   No ischemic etiology for wide complex tachycardia Suspect wide complex tachycardia was Afib w/aberrancy Okay to start Eliquis  this evening (CHA2DS2CVASc score 3 (Age, female, hypertension) Okay to discharge 6/7 AM if Jeanne Wilson can be weaned off IV amiodarone     _____________   History of Present Illness   Jeanne Wilson is a 73 y.o. female with a past medical history of chest  discomfort with normal cardiac catheterization in 2006, HTN, HLD who is being seen 06/03/2024 for the evaluation of new onset atrial fibrillation.   Jeanne Wilson is a 73 year old female with above medical history who is followed by Dr. Audery Wilson. Per chart review, Jeanne Wilson has a history of non-cardiac chest discomfort. Previously had a cardiac catheterization in 2006 that showed normal coronary arteries. Her most recent echocardiogram from 12/2021 showed EF 60%, no regional wall motion abnormalities, normal RV systolic function, no significant valvular abnormalities. Jeanne Wilson was last seen by Jeanne Jetty NP in 05/2023. At that time, Jeanne Wilson was doing well. Denied chest pain. BP was well controlled and Jeanne Wilson was on lipitor for HLD    Jeanne Wilson called the cardiology office on 5/27 complaining of palpitations. Reported feeling a "fluttering" feeling in her chest. Also had some shortness of breath at night when laying down and Jeanne Wilson had a slight cough. Jeanne Wilson was scheduled for an outpatient appointment, but this has not yet occurred.    Jeanne Wilson presented to the ED on 06/03/24 complaining of centralized chest pain that radiated to her right arm and neck. Associated with some SOB and palpitations. EMS was called and Jeanne Wilson was found to be tachycardic with HR in the 180s. Gave adenosine  6 mg without rhythm changes.    In the ED, initial vital signs showed HR 181, BP 137/91, oxygen 98% on room air. EKG showed a wide-complex, regular tachycardia with HR 181 Bpm. Jeanne Wilson was given 2 doses of IV adenosine  12 mg but did not convert. Jeanne Wilson was given 15 mg IV diltiazem  without improvement. Attempted  IV amiodarone , again without improvement. Jeanne Wilson became hypotensive, lightheaded and dizzy. Decision was made to cardiovert.    Jeanne Wilson cardioverted successfully to atrial fibrillation, which is a new diagnosis. Jeanne Wilson was started on IV heparin . While in Afib, her HR was overall well controlled in the 80s-90s.   Labs in the ED showed BNP elevated to 276.  hsTn 20, repeat pending. TSH within normal limits. K 4.2, mag 2.1, creatinine 0.77. WBC slightly elevated to 13.3. CXR showed cardiomegaly and pulmonary vascular congestion, left lower lung opacification compatible with pleural effusion.    On interview, Jeanne Wilson reports that Jeanne Wilson had been doing well from a cardiac standpoint up until March of this year. In March, Jeanne Wilson started to have episodes of palpitations/fluttering in her chest. Episodes were usually mild and brief, so Jeanne Wilson did not worry too much about them. Jeanne Wilson continued to have episodes of palpitations intermittently. In May, Jeanne Wilson noticed that Jeanne Wilson was much more fatigued than usual. Jeanne Wilson also noticed that Jeanne Wilson was getting short of breath on exertion and felt like Jeanne Wilson "could not get a full breath". Jeanne Wilson also started to have orthopnea and needed a wedge pillow to sleep. Jeanne Wilson had a mild, dry cough. No lower extremity swelling, abdominal distention, or weight gain. Jeanne Wilson called our office and scheduled an appointment, which has not yet occurred.    Last night, Jeanne Wilson woke up from sleep and went to the kitchen to grab a snack. Jeanne Wilson sat down to eat, and started to have significant pounding in her chest. Jeanne Wilson also felt diaphoretic, nauseous. Jeanne Wilson went on to develop chest pain that radiated down her right arm. Jeanne Wilson took a nitroglycerin  tablet, and the pain eased off a bit. Jeanne Wilson continued to have chest pressure and an elevated HR so Jeanne Wilson called EMS. After being cardioverted in the ED, her HR has improved to the 80s-90s. Jeanne Wilson is feeling much better and no longer has chest discomfort.      Hospital Course   Consultants: N/A   Jeanne Wilson was admitted to cardiology service on 06/03/2024.  Initial EKG in the ED showed irregular wide-complex tachycardia with heart rate of 181 bpm, failed to slow down despite 6 mg --> 12 mg --> 12 mg of adenosine .  Heart rate did not slow down with IV diltiazem  or IV amiodarone .  Jeanne Wilson ultimately needed emergent cardioversion and subsequently went into  atrial fibrillation with well-controlled heart rate. Echocardiogram obtained on 06/03/2024 showed EF 45 to 50%, global hypokinesis, mild MR.  Subsequent cardiac catheterization performed on the same day showed no CAD or ischemic etiology to explain the wide-complex tachycardia, therefore it was suspected wide-complex tachycardia was A-fib but the prep Baron C.  Jeanne Wilson was started back on Eliquis  the night of the procedure.  Jeanne Wilson was seen in the morning of 06/04/2024 at which time he was doing well.  He was started on low-dose carvedilol.  IV amiodarone  was transitioned to oral dose.  Jeanne Wilson is deemed stable for discharge from the cardiac perspective.  Jeanne Wilson has follow-up with Dr. Audery Wilson in 06/16/2024.  After 3 weeks of anticoagulation, may consider outpatient cardioversion again.  At the recommendation of Dr. Nishan, an outpatient 30 day heart monitor has been set up.      Did the Jeanne Wilson have an acute coronary syndrome (MI, NSTEMI, STEMI, etc) this admission?:  No                               Did the  Jeanne Wilson have a percutaneous coronary intervention (stent / angioplasty)?:  No.          _____________  Discharge Vitals Blood pressure 115/67, pulse 87, temperature 97.9 F (36.6 C), temperature source Oral, resp. rate 20, height 5\' 4"  (1.626 m), weight 70 kg, SpO2 94%.  Filed Weights   06/03/24 0602 06/03/24 2008  Weight: 68.9 kg 70 kg    Labs & Radiologic Studies  CBC Recent Labs    06/03/24 0603 06/03/24 0621 06/04/24 0210  WBC 13.3*  --  7.3  NEUTROABS 8.4*  --   --   HGB 13.3 13.6 12.6  HCT 42.4 40.0 39.1  MCV 101.2*  --  97.0  PLT 283  --  231   Basic Metabolic Panel Recent Labs    16/10/96 0603 06/03/24 0621 06/04/24 0210  NA 140 138 136  K 4.2 4.2 3.6  CL 108 107 106  CO2 19*  --  22  GLUCOSE 115* 116* 108*  BUN 14 16 10   CREATININE 0.77 0.80 0.77  CALCIUM  8.9  --  8.6*  MG 2.1  --   --    Liver Function Tests Recent Labs    06/03/24 0603  AST 21  ALT 22   ALKPHOS 72  BILITOT 0.3  PROT 5.9*  ALBUMIN 3.5   No results for input(s): "LIPASE", "AMYLASE" in the last 72 hours. High Sensitivity Troponin:   Recent Labs  Lab 06/03/24 0603 06/03/24 0803  TROPONINIHS 20* 27*    No results for input(s): "TRNPT" in the last 720 hours.  BNP Invalid input(s): "POCBNP" No results for input(s): "PROBNP" in the last 72 hours.  Recent Labs    06/03/24 0601  BNP 276.3*    D-Dimer Recent Labs    06/03/24 0603  DDIMER <0.27   Hemoglobin A1C No results for input(s): "HGBA1C" in the last 72 hours. Fasting Lipid Panel Recent Labs    06/04/24 0210  CHOL 129  HDL 39*  LDLCALC 58  TRIG 045*  CHOLHDL 3.3   No results found for: "LIPOA"  Thyroid  Function Tests Recent Labs    06/03/24 0603  TSH 2.370   _____________  CARDIAC CATHETERIZATION Addendum Date: 06/03/2024 Coronary angiography 06/03/2024: LM: Normal LAD: Normal, no significant disease Lcx: Dominant, no significant disease RCA: Non-dominant, no significant disease LVEDP 8 mmHg No ischemic etiology for wide complex tachycardia Suspect wide complex tachycardia was Afib w/aberrancy Okay to start Eliquis  this evening (CHA2DS2CVASc score 3 (Age, female, hypertension) Okay to discharge 6/7 AM if Jeanne Wilson can be weaned off IV amiodarone  Cody Das, MD   Result Date: 06/03/2024 Coronary angiography 06/03/2024: LM: Normal LAD: Normal, no significant disease Lcx: Dominant, no significant disease RCA: Non-dominant, no significant disease LVEDP 8 mmHg No ischemic etiology for wide complex tachycardia Suspect wide complex tachycardia was Afib w/aberrancy Okay to start Eliquis  this evening (CHA2DS2CVASc score 3 (Age, female, hypertension) Cody Das, MD   ECHOCARDIOGRAM COMPLETE Result Date: 06/03/2024    ECHOCARDIOGRAM REPORT   Jeanne Wilson Name:   Jeanne Wilson Date of Exam: 06/03/2024 Medical Rec #:  [id number]     Height:       64.0 in Accession #:    [id number]    Weight:       152.0 lb Date of  Birth:  March 24, 1951      BSA:          1.741 m Jeanne Wilson Age:    72 years      BP:  109/80 mmHg Jeanne Wilson Gender: F             HR:           90 bpm. Exam Location:  Inpatient Procedure: 2D Echo, Cardiac Doppler, Color Doppler and Intracardiac            Opacification Agent (Both Spectral and Color Flow Doppler were            utilized during procedure). Indications:    Chest Pain  History:        Jeanne Wilson has prior history of Echocardiogram examinations, most                 recent 01/16/2022. Arrythmias:Atrial Fibrillation; Risk                 Factors:Hypertension.  Sonographer:    Sulema Endo RDCS, FE, PE Referring Phys: [id number] Debria Fang IMPRESSIONS  1. Left ventricular ejection fraction, by estimation, is 45 to 50%. The left ventricle has mildly decreased function. The left ventricle demonstrates global hypokinesis. The left ventricular internal cavity size was mildly dilated. Left ventricular diastolic parameters were normal.  2. Right ventricular systolic function is normal. The right ventricular size is normal.  3. Left atrial size was mildly dilated.  4. The mitral valve is abnormal. Mild mitral valve regurgitation. No evidence of mitral stenosis.  5. The aortic valve is tricuspid. There is mild calcification of the aortic valve. There is mild thickening of the aortic valve. Aortic valve regurgitation is not visualized. Aortic valve sclerosis is present, with no evidence of aortic valve stenosis.  6. The inferior vena cava is normal in size with greater than 50% respiratory variability, suggesting right atrial pressure of 3 mmHg. FINDINGS  Left Ventricle: Left ventricular ejection fraction, by estimation, is 45 to 50%. The left ventricle has mildly decreased function. The left ventricle demonstrates global hypokinesis. Strain was performed and the global longitudinal strain is indeterminate. The left ventricular internal cavity size was mildly dilated. There is no left ventricular hypertrophy.  Left ventricular diastolic parameters were normal. Right Ventricle: The right ventricular size is normal. No increase in right ventricular wall thickness. Right ventricular systolic function is normal. Left Atrium: Left atrial size was mildly dilated. Right Atrium: Right atrial size was normal in size. Pericardium: There is no evidence of pericardial effusion. Mitral Valve: The mitral valve is abnormal. There is mild thickening of the mitral valve leaflet(s). There is mild calcification of the mitral valve leaflet(s). Mild mitral valve regurgitation. No evidence of mitral valve stenosis. Tricuspid Valve: The tricuspid valve is normal in structure. Tricuspid valve regurgitation is mild . No evidence of tricuspid stenosis. Aortic Valve: The aortic valve is tricuspid. There is mild calcification of the aortic valve. There is mild thickening of the aortic valve. Aortic valve regurgitation is not visualized. Aortic valve sclerosis is present, with no evidence of aortic valve stenosis. Pulmonic Valve: The pulmonic valve was normal in structure. Pulmonic valve regurgitation is trivial. No evidence of pulmonic stenosis. Aorta: The aortic root is normal in size and structure. Venous: The inferior vena cava is normal in size with greater than 50% respiratory variability, suggesting right atrial pressure of 3 mmHg. IAS/Shunts: No atrial level shunt detected by color flow Doppler. Additional Comments: 3D was performed not requiring image post processing on an independent workstation and was indeterminate.   LV Volumes (MOD) LV vol d, MOD A2C: 82.1 ml LV vol s, MOD A2C: 53.5 ml LV SV MOD A2C:  28.6 ml Janelle Mediate MD Electronically signed by Janelle Mediate MD Signature Date/Time: 06/03/2024/12:33:59 PM    Final    DG Chest Portable 1 View Result Date: 06/03/2024 CLINICAL DATA:  Shortness of breath. EXAM: PORTABLE CHEST 1 VIEW COMPARISON:  01/24/18. FINDINGS: There is mild cardiac enlargement. Veil like opacification over the  left lower lung with blunting of the left costophrenic angle. Pulmonary vascular congestion noted. Scar versus subsegmental atelectasis in the right base. IMPRESSION: 1. Cardiomegaly and pulmonary vascular congestion. 2. Left lower lung opacification compatible with pleural effusion and atelectasis. Electronically Signed   By: Kimberley Penman M.D.   On: 06/03/2024 06:53    Disposition Pt is being discharged home today in good condition.  Follow-up Plans & Appointments  Follow-up Information     Lenise Quince, MD Follow up on 06/16/2024.   Specialty: Cardiology Why: 8:40AM. Cardiology follow up Contact information: [id number] [place] [id number] [phone]                  Discharge Medications Allergies as of 06/04/2024       Reactions   Azithromycin Nausea Only   Codeine Hives        Medication List     STOP taking these medications    amLODipine  2.5 MG tablet Commonly known as: NORVASC        TAKE these medications    amiodarone  200 MG tablet Commonly known as: PACERONE  Take amiodarone  200mg  BID for 2 weeks, then go down to 1 tablet daily thereafter   apixaban  5 MG Tabs tablet Commonly known as: ELIQUIS  Take 1 tablet (5 mg total) by mouth 2 (two) times daily.   atorvastatin  10 MG tablet Commonly known as: LIPITOR Take 10 mg by mouth every morning.   CALCIUM -MAGNESIUM-ZINC PO Take 1 capsule by mouth every morning.   carvedilol 3.125 MG tablet Commonly known as: COREG Take 1 tablet (3.125 mg total) by mouth 2 (two) times daily with a meal.   GLUCOSAMINE PO Take 2 tablets by mouth every morning.   ibandronate 150 MG tablet Commonly known as: BONIVA Take 150 mg by mouth every 30 (thirty) days. Take in the morning with a full glass of water, on an empty stomach, and do not take anything else by mouth or lie down for the next 30 min.   losartan 25 MG tablet Commonly known as: COZAAR Take 1 tablet (25 mg total) by mouth  daily. Start taking on: June 05, 2024   nitroGLYCERIN  0.4 MG SL tablet Commonly known as: NITROSTAT  Place 1 tablet (0.4 mg total) under the tongue every 5 (five) minutes as needed.   zolpidem  5 MG tablet Commonly known as: AMBIEN  Take 5 mg by mouth at bedtime.         Outstanding Labs/Studies N/A  Duration of Discharge Encounter: APP Time: 15 minutes   Signed, Dominyck Reser, PA 06/04/2024, 12:31 PM

## 2024-06-04 NOTE — Progress Notes (Signed)
   Notified by RN that patient had gone to [facility] to pick up her medications and was told it was too soon to fill her Eliquis  and carvedilol.  When discharged, her meds were initially sent to a different [facility] location that was closed over the weekend.  Meds were resent to [facility] on [address] [place].  I called [facility] [address].  Per their staff, her insurance was showing that it was too soon to refill the carvedilol and Eliquis  because these medications had been sent to a separate pharmacy.  The initial [facility] location does not open till Monday, so patient was unable to pick up her Eliquis  or carvedilol.  I called our [facility] pharmacy.  As long as patient is able to make it to [facility] before 4:45 PM, they would be able to fill her amiodarone , Eliquis , carvedilol.  I discussed with [facility] pharmacy staff.  Amiodarone  was covered by insurance without issue. The carvedilol was too early to fill, but they were able to get the cost down to $5. Theliquis  was also too soon to fill, but she was able to use a 30 day free coupon.   Debria Fang, PA-C 06/04/2024 4:35 PM

## 2024-06-04 NOTE — Progress Notes (Signed)
 30 day event monitor ordered at the request of Dr. Nishan

## 2024-06-04 NOTE — Care Management Obs Status (Signed)
 MEDICARE OBSERVATION STATUS NOTIFICATION   Patient Details  Name: Jeanne Wilson MRN: [id number] Date of Birth: 1951/02/01   Medicare Observation Status Notification Given:  Yes    [name], RN 06/04/2024, 1:02 PM

## 2024-06-04 NOTE — Care Management CC44 (Signed)
 Condition Code 44 Documentation Completed  Patient Details  Name: Jeanne Wilson MRN: [id number] Date of Birth: November 15, 1951   Condition Code 44 given:  Yes Patient signature on Condition Code 44 notice:  Yes Documentation of 2 MD's agreement:  Yes Code 44 added to claim:  Yes    [name], RN 06/04/2024, 1:02 PM

## 2024-06-04 NOTE — Progress Notes (Signed)
 Cardiologist Crenshaw  Subjective:  Denies SSCP, palpitations or Dyspnea   Objective:  Vitals:   06/03/24 2300 06/04/24 0200 06/04/24 0300 06/04/24 0747  BP: (!) 101/51 107/65 134/70 113/68  Pulse: 70 72 75 77  Resp: 19 16 18 19   Temp: 98 F (36.7 C)  98.6 F (37 C) 97.9 F (36.6 C)  TempSrc: Oral  Oral Oral  SpO2: 94% 91% 93% 95%  Weight:      Height:        Intake/Output from previous day:  Intake/Output Summary (Last 24 hours) at 06/04/2024 0755 Last data filed at 06/04/2024 0754 Gross per 24 hour  Intake 834.72 ml  Output 1300 ml  Net -465.28 ml    Physical Exam: No distress Neuro non focal Lungs clear Right radial cath site A Abdomen benign No edema  Lab Results: Basic Metabolic Panel: Recent Labs    06/03/24 0603 06/03/24 0621 06/04/24 0210  NA 140 138 136  K 4.2 4.2 3.6  CL 108 107 106  CO2 19*  --  22  GLUCOSE 115* 116* 108*  BUN 14 16 10   CREATININE 0.77 0.80 0.77  CALCIUM  8.9  --  8.6*  MG 2.1  --   --    Liver Function Tests: Recent Labs    06/03/24 0603  AST 21  ALT 22  ALKPHOS 72  BILITOT 0.3  PROT 5.9*  ALBUMIN 3.5   No results for input(s): "LIPASE", "AMYLASE" in the last 72 hours. CBC: Recent Labs    06/03/24 0603 06/03/24 0621 06/04/24 0210  WBC 13.3*  --  7.3  NEUTROABS 8.4*  --   --   HGB 13.3 13.6 12.6  HCT 42.4 40.0 39.1  MCV 101.2*  --  97.0  PLT 283  --  231    BNP: Invalid input(s): "POCBNP" D-Dimer: Recent Labs    06/03/24 0603  DDIMER <0.27    Fasting Lipid Panel: Recent Labs    06/04/24 0210  CHOL 129  HDL 39*  LDLCALC 58  TRIG 161*  CHOLHDL 3.3   Thyroid  Function Tests: Recent Labs    06/03/24 0603  TSH 2.370     Imaging: CARDIAC CATHETERIZATION Addendum Date: 06/03/2024 Coronary angiography 06/03/2024: LM: Normal LAD: Normal, no significant disease Lcx: Dominant, no significant disease RCA: Non-dominant, no significant disease LVEDP 8 mmHg No ischemic etiology for wide complex  tachycardia Suspect wide complex tachycardia was Afib w/aberrancy Okay to start Eliquis  this evening (CHA2DS2CVASc score 3 (Age, female, hypertension) Okay to discharge 6/7 AM if she can be weaned off IV amiodarone  Cody Das, MD   Result Date: 06/03/2024 Coronary angiography 06/03/2024: LM: Normal LAD: Normal, no significant disease Lcx: Dominant, no significant disease RCA: Non-dominant, no significant disease LVEDP 8 mmHg No ischemic etiology for wide complex tachycardia Suspect wide complex tachycardia was Afib w/aberrancy Okay to start Eliquis  this evening (CHA2DS2CVASc score 3 (Age, female, hypertension) Cody Das, MD   ECHOCARDIOGRAM COMPLETE Result Date: 06/03/2024    ECHOCARDIOGRAM REPORT   Patient Name:   Jeanne Wilson Date of Exam: 06/03/2024 Medical Rec #:  [id number]     Height:       64.0 in Accession #:    [id number]    Weight:       152.0 lb Date of Birth:  05-27-1951      BSA:          1.741 m Patient Age:    73 years      BP:  109/80 mmHg Patient Gender: F             HR:           90 bpm. Exam Location:  Inpatient Procedure: 2D Echo, Cardiac Doppler, Color Doppler and Intracardiac            Opacification Agent (Both Spectral and Color Flow Doppler were            utilized during procedure). Indications:    Chest Pain  History:        Patient has prior history of Echocardiogram examinations, most                 recent 01/16/2022. Arrythmias:Atrial Fibrillation; Risk                 Factors:Hypertension.  Sonographer:    Sulema Endo RDCS, FE, PE Referring Phys: [id number] Debria Fang IMPRESSIONS  1. Left ventricular ejection fraction, by estimation, is 45 to 50%. The left ventricle has mildly decreased function. The left ventricle demonstrates global hypokinesis. The left ventricular internal cavity size was mildly dilated. Left ventricular diastolic parameters were normal.  2. Right ventricular systolic function is normal. The right ventricular size is normal.  3.  Left atrial size was mildly dilated.  4. The mitral valve is abnormal. Mild mitral valve regurgitation. No evidence of mitral stenosis.  5. The aortic valve is tricuspid. There is mild calcification of the aortic valve. There is mild thickening of the aortic valve. Aortic valve regurgitation is not visualized. Aortic valve sclerosis is present, with no evidence of aortic valve stenosis.  6. The inferior vena cava is normal in size with greater than 50% respiratory variability, suggesting right atrial pressure of 3 mmHg. FINDINGS  Left Ventricle: Left ventricular ejection fraction, by estimation, is 45 to 50%. The left ventricle has mildly decreased function. The left ventricle demonstrates global hypokinesis. Strain was performed and the global longitudinal strain is indeterminate. The left ventricular internal cavity size was mildly dilated. There is no left ventricular hypertrophy. Left ventricular diastolic parameters were normal. Right Ventricle: The right ventricular size is normal. No increase in right ventricular wall thickness. Right ventricular systolic function is normal. Left Atrium: Left atrial size was mildly dilated. Right Atrium: Right atrial size was normal in size. Pericardium: There is no evidence of pericardial effusion. Mitral Valve: The mitral valve is abnormal. There is mild thickening of the mitral valve leaflet(s). There is mild calcification of the mitral valve leaflet(s). Mild mitral valve regurgitation. No evidence of mitral valve stenosis. Tricuspid Valve: The tricuspid valve is normal in structure. Tricuspid valve regurgitation is mild . No evidence of tricuspid stenosis. Aortic Valve: The aortic valve is tricuspid. There is mild calcification of the aortic valve. There is mild thickening of the aortic valve. Aortic valve regurgitation is not visualized. Aortic valve sclerosis is present, with no evidence of aortic valve stenosis. Pulmonic Valve: The pulmonic valve was normal in  structure. Pulmonic valve regurgitation is trivial. No evidence of pulmonic stenosis. Aorta: The aortic root is normal in size and structure. Venous: The inferior vena cava is normal in size with greater than 50% respiratory variability, suggesting right atrial pressure of 3 mmHg. IAS/Shunts: No atrial level shunt detected by color flow Doppler. Additional Comments: 3D was performed not requiring image post processing on an independent workstation and was indeterminate.   LV Volumes (MOD) LV vol d, MOD A2C: 82.1 ml LV vol s, MOD A2C: 53.5 ml LV SV MOD A2C:  28.6 ml Janelle Mediate MD Electronically signed by Janelle Mediate MD Signature Date/Time: 06/03/2024/12:33:59 PM    Final    DG Chest Portable 1 View Result Date: 06/03/2024 CLINICAL DATA:  Shortness of breath. EXAM: PORTABLE CHEST 1 VIEW COMPARISON:  01/24/18. FINDINGS: There is mild cardiac enlargement. Veil like opacification over the left lower lung with blunting of the left costophrenic angle. Pulmonary vascular congestion noted. Scar versus subsegmental atelectasis in the right base. IMPRESSION: 1. Cardiomegaly and pulmonary vascular congestion. 2. Left lower lung opacification compatible with pleural effusion and atelectasis. Electronically Signed   By: Kimberley Penman M.D.   On: 06/03/2024 06:53    Cardiac Studies:  ECG:    Telemetry:  afib rates 60-70s   Echo: EF 45-50% mild MR  Medications:    apixaban   5 mg Oral BID   aspirin  EC  81 mg Oral Daily   atorvastatin   10 mg Oral QHS   zolpidem   5 mg Oral QHS      amiodarone  30 mg/hr (06/04/24 0300)    Assessment/Plan:   WCT/PAF:  appears to be PAF with aberrancy Eliquis  restarted dose last night . Given WCT and QT 486 and mildly decreased EF on amiodarone  Will be on DOAC 3 weeks and consider outpatient [facility] EF:  mildly reduced cath with no CAD ? Rate related to PAF. Start low dose coreg and ARB f/u echo in 3 months or post [facility].  HLD  on statin  Will arrange outpatient f/u with afib  clinic/[facility] and 30 day monitor Ok to d/c home today Patient feels comfortable with this   Janelle Mediate 06/04/2024, 7:55 AM

## 2024-06-05 ENCOUNTER — Encounter (HOSPITAL_COMMUNITY): Payer: Self-pay | Admitting: Cardiology

## 2024-06-05 LAB — LIPOPROTEIN A (LPA): Lipoprotein (a): 13.3 nmol/L (ref <75.0)

## 2024-06-07 ENCOUNTER — Ambulatory Visit: Payer: Self-pay | Admitting: *Deleted

## 2024-06-08 DIAGNOSIS — I4819 Other persistent atrial fibrillation: Secondary | ICD-10-CM | POA: Diagnosis not present

## 2024-06-08 DIAGNOSIS — R9431 Abnormal electrocardiogram [ECG] [EKG]: Secondary | ICD-10-CM | POA: Diagnosis not present

## 2024-06-13 DIAGNOSIS — I4891 Unspecified atrial fibrillation: Secondary | ICD-10-CM | POA: Diagnosis not present

## 2024-06-13 DIAGNOSIS — I10 Essential (primary) hypertension: Secondary | ICD-10-CM | POA: Diagnosis not present

## 2024-06-13 DIAGNOSIS — I341 Nonrheumatic mitral (valve) prolapse: Secondary | ICD-10-CM | POA: Diagnosis not present

## 2024-06-16 ENCOUNTER — Encounter: Payer: Self-pay | Admitting: Cardiology

## 2024-06-16 ENCOUNTER — Ambulatory Visit: Attending: Cardiology | Admitting: Cardiology

## 2024-06-16 VITALS — BP 128/70 | HR 87 | Ht 64.0 in | Wt 158.6 lb

## 2024-06-16 DIAGNOSIS — I10 Essential (primary) hypertension: Secondary | ICD-10-CM | POA: Diagnosis not present

## 2024-06-16 DIAGNOSIS — Z01812 Encounter for preprocedural laboratory examination: Secondary | ICD-10-CM | POA: Diagnosis not present

## 2024-06-16 DIAGNOSIS — I4819 Other persistent atrial fibrillation: Secondary | ICD-10-CM

## 2024-06-16 DIAGNOSIS — E7800 Pure hypercholesterolemia, unspecified: Secondary | ICD-10-CM | POA: Diagnosis not present

## 2024-06-16 NOTE — Patient Instructions (Signed)
 Medication Instructions:  No changes *If you need a refill on your cardiac medications before your next appointment, please call your pharmacy*  Lab Work: CBC, BMP If you have labs (blood work) drawn today and your tests are completely normal, you will receive your results only by: MyChart Message (if you have MyChart) OR A paper copy in the mail If you have any lab test that is abnormal or we need to change your treatment, we will call you to review the results.  Testing/Procedures: Your physician has recommended that you have a Cardioversion (DCCV). Electrical Cardioversion uses a jolt of electricity to your heart either through paddles or wired patches attached to your chest. This is a controlled, usually prescheduled, procedure. Defibrillation is done under light anesthesia in the hospital, and you usually go home the day of the procedure. This is done to get your heart back into a normal rhythm. You are not awake for the procedure. Please see the instruction sheet given to you today.      Dear Jeanne Wilson  You are scheduled for a Cardioversion on Thursday, July 17 with Dr. Sheryle Donning.  Please arrive at the [facility] (Main Entrance A) at [facility]: [address] [place], [place] [address] at 7:30 AM (This time is 1 hour(s) before your procedure to ensure your preparation).   Free valet parking service is available. You will check in at ADMITTING.   *Please Note: You will receive a call the day before your procedure to confirm the appointment time. That time may have changed from the original time based on the schedule for that day.*   DIET:  Nothing to eat or drink after midnight except a sip of water with medications (see medication instructions below)  MEDICATION INSTRUCTIONS: !!IF ANY NEW MEDICATIONS ARE STARTED AFTER TODAY, PLEASE NOTIFY YOUR PROVIDER AS SOON AS POSSIBLE!!  FYI: Medications such as Semaglutide (Ozempic, [place]), Tirzepatide (Mounjaro,  Zepbound), Dulaglutide (Trulicity), etc (GLP1 agonists) AND Canagliflozin (Invokana), Dapagliflozin (Farxiga), Empagliflozin (Jardiance), Ertugliflozin (Steglatro), Bexagliflozin [employer]) or any combination with one of these drugs such as Invokamet (Canagliflozin/Metformin), Synjardy (Empagliflozin/Metformin), etc (SGLT2 inhibitors) must be held around the time of a procedure. This is not a comprehensive list of all of these drugs. Please review all of your medications and talk to your provider if you take any one of these. If you are not sure, ask your provider.   Continue taking your anticoagulant (blood thinner): Apixaban  (Eliquis ).  You will need to continue this after your procedure until you are told by your provider that it is safe to stop.    LABS:  Come to the lab at the [facility] ([address], [place], 1st Floor) between the hours of 8:00 am and 4:30 pm. You do NOT have to be fasting.  FYI:  For your safety, and to allow us  to monitor your vital signs accurately during the surgery/procedure we request: If you have artificial nails, gel coating, SNS etc, please have those removed prior to your surgery/procedure. Not having the nail coverings /polish removed may result in cancellation or delay of your surgery/procedure.  Your support person will be asked to wait in the waiting room during your procedure.  It is OK to have someone drop you off and come back when you are ready to be discharged.  You cannot drive after the procedure and will need someone to drive you home.  Bring your insurance cards.  *Special Note:  Every effort is made to have your procedure done on time. Occasionally there are emergencies that occur at the hospital that may cause delays. Please be patient if a delay does occur.      Follow-Up: At [facility], you and your health needs are our priority.  As part of our continuing mission to provide you  with exceptional heart care, our providers are all part of one team.  This team includes your primary Cardiologist (physician) and Advanced Practice Providers or APPs (Physician Assistants and Nurse Practitioners) who all work together to provide you with the care you need, when you need it.  Your next appointment:   3 month(s)  Provider:   Alexandria Angel, MD    We recommend signing up for the patient portal called MyChart.  Sign up information is provided on this After Visit Summary.  MyChart is used to connect with patients for Virtual Visits (Telemedicine).  Patients are able to view lab/test results, encounter notes, upcoming appointments, etc.  Non-urgent messages can be sent to your provider as well.   To learn more about what you can do with MyChart, go to [email or web address].

## 2024-06-17 ENCOUNTER — Ambulatory Visit: Payer: Self-pay | Admitting: Cardiology

## 2024-06-17 LAB — CBC
Hematocrit: 42.8 % (ref 34.0–46.6)
Hemoglobin: 14 g/dL (ref 11.1–15.9)
MCH: 31.9 pg (ref 26.6–33.0)
MCHC: 32.7 g/dL (ref 31.5–35.7)
MCV: 98 fL — ABNORMAL HIGH (ref 79–97)
Platelets: 300 10*3/uL (ref 150–450)
RBC: 4.39 x10E6/uL (ref 3.77–5.28)
RDW: 12.1 % (ref 11.7–15.4)
WBC: 9.7 10*3/uL (ref 3.4–10.8)

## 2024-06-17 LAB — BASIC METABOLIC PANEL WITH GFR
BUN/Creatinine Ratio: 12 (ref 12–28)
BUN: 17 mg/dL (ref 8–27)
CO2: 21 mmol/L (ref 20–29)
Calcium: 9.6 mg/dL (ref 8.7–10.3)
Chloride: 100 mmol/L (ref 96–106)
Creatinine, Ser: 1.4 mg/dL — ABNORMAL HIGH (ref 0.57–1.00)
Glucose: 82 mg/dL (ref 70–99)
Potassium: 4.8 mmol/L (ref 3.5–5.2)
Sodium: 140 mmol/L (ref 134–144)
eGFR: 40 mL/min/{1.73_m2} — ABNORMAL LOW (ref >59)

## 2024-06-21 ENCOUNTER — Other Ambulatory Visit: Payer: Self-pay | Admitting: *Deleted

## 2024-06-21 DIAGNOSIS — I4819 Other persistent atrial fibrillation: Secondary | ICD-10-CM

## 2024-07-04 ENCOUNTER — Other Ambulatory Visit: Payer: Self-pay

## 2024-07-04 ENCOUNTER — Other Ambulatory Visit (HOSPITAL_COMMUNITY): Payer: Self-pay

## 2024-07-07 ENCOUNTER — Encounter: Payer: Self-pay | Admitting: Cardiology

## 2024-07-12 ENCOUNTER — Telehealth (HOSPITAL_BASED_OUTPATIENT_CLINIC_OR_DEPARTMENT_OTHER): Payer: Self-pay | Admitting: Cardiology

## 2024-07-12 ENCOUNTER — Ambulatory Visit: Attending: Cardiology

## 2024-07-12 DIAGNOSIS — R9431 Abnormal electrocardiogram [ECG] [EKG]: Secondary | ICD-10-CM

## 2024-07-12 DIAGNOSIS — I4819 Other persistent atrial fibrillation: Secondary | ICD-10-CM

## 2024-07-12 NOTE — Telephone Encounter (Signed)
 Patient called to follow-up on instructions for her procedure on 7/17.

## 2024-07-12 NOTE — Telephone Encounter (Signed)
 Patient states she does not know what time she needs to arrive at the hospital for her cardioversion on July 17th. Informed patient she needs to arrive at 7:30 AM that morning. Reviewed DCCV instructions with patient, all questions answered.  Patient verbalized understanding and expressed appreciation for call.

## 2024-07-13 ENCOUNTER — Ambulatory Visit: Payer: Self-pay | Admitting: Cardiology

## 2024-07-13 NOTE — Telephone Encounter (Signed)
 Left message to call back

## 2024-07-13 NOTE — Telephone Encounter (Signed)
-----   Message from Rollo FABIENE Louder sent at 07/13/2024 11:44 AM EDT ----- Please tell patient that her cardiac monitor showed 94% afib burden, meaning she was in atrial fibrillation 94% of the time. Average HR 80 BPM. NO changes to treatment plan at this time, follow up as  arranged  Thanks KJ  ----- Message ----- From: Inocencio Soyla Lunger, MD Sent: 07/12/2024   2:42 PM EDT To: Rollo JONELLE Louder, PA-C

## 2024-07-13 NOTE — Progress Notes (Signed)
 Spoke to patient and instructed them to come at 0730  and to be NPO after 0000.     Confirmed that patient will have a ride home and someone to stay with them for 24 hours after the procedure.  Confirmed blood thinner. Confirmed no breaks in taking blood thinner for 3+ weeks prior to procedure.

## 2024-07-14 ENCOUNTER — Encounter (HOSPITAL_COMMUNITY): Payer: Self-pay | Admitting: Certified Registered Nurse Anesthetist

## 2024-07-14 ENCOUNTER — Other Ambulatory Visit: Payer: Self-pay

## 2024-07-14 ENCOUNTER — Encounter (HOSPITAL_COMMUNITY)
Admission: RE | Disposition: A | Discharge status: Home / Self Care | Payer: Self-pay | Source: Home / Self Care | Attending: Cardiology

## 2024-07-14 ENCOUNTER — Ambulatory Visit (HOSPITAL_COMMUNITY)
Admission: RE | Admit: 2024-07-14 | Discharge: 2024-07-14 | Disposition: A | Discharge status: Home / Self Care | Attending: Cardiology | Admitting: Cardiology

## 2024-07-14 DIAGNOSIS — I4819 Other persistent atrial fibrillation: Secondary | ICD-10-CM | POA: Diagnosis not present

## 2024-07-14 DIAGNOSIS — Z538 Procedure and treatment not carried out for other reasons: Secondary | ICD-10-CM | POA: Diagnosis not present

## 2024-07-14 SURGERY — CARDIOVERSION (CATH LAB)
Anesthesia: General

## 2024-07-14 MED ORDER — SODIUM CHLORIDE 0.9% FLUSH: 3.0000 mL | INTRAVENOUS | Status: DC | PRN

## 2024-07-14 MED ORDER — SODIUM CHLORIDE 0.9% FLUSH: 3.0000 mL | Freq: Two times a day (BID) | INTRAVENOUS | Status: DC

## 2024-07-14 NOTE — Telephone Encounter (Signed)
 Left message to call back

## 2024-07-14 NOTE — Telephone Encounter (Signed)
-----   Message from Rollo FABIENE Louder sent at 07/13/2024 11:44 AM EDT ----- Please tell patient that her cardiac monitor showed 94% afib burden, meaning she was in atrial fibrillation 94% of the time. Average HR 80 BPM. NO changes to treatment plan at this time, follow up as  arranged  Thanks KJ  ----- Message ----- From: Inocencio Soyla Lunger, MD Sent: 07/12/2024   2:42 PM EDT To: Rollo JONELLE Louder, PA-C

## 2024-07-14 NOTE — Progress Notes (Signed)
 Patient presented for cardioversion. On arrival, connected to monitors, showed sinus rhythm. Confirmed with 12 lead ECG. Cardioversion cancelled.  Shelda Bruckner, MD, PhD, [facility]  [facility]  [place]  Heart & Vascular at [facility] at [facility] [address] [place], [place] [address] [phone]

## 2024-07-20 NOTE — Telephone Encounter (Signed)
 Called patient advised of below they verbalized understanding.

## 2024-07-20 NOTE — Telephone Encounter (Signed)
-----   Message from Rollo FABIENE Louder sent at 07/13/2024 11:44 AM EDT ----- Please tell patient that her cardiac monitor showed 94% afib burden, meaning she was in atrial fibrillation 94% of the time. Average HR 80 BPM. NO changes to treatment plan at this time, follow up as  arranged  Thanks KJ  ----- Message ----- From: Inocencio Soyla Lunger, MD Sent: 07/12/2024   2:42 PM EDT To: Rollo JONELLE Louder, PA-C

## 2024-07-29 ENCOUNTER — Ambulatory Visit: Admitting: Cardiology

## 2024-08-03 ENCOUNTER — Other Ambulatory Visit (HOSPITAL_COMMUNITY): Payer: Self-pay

## 2024-08-04 ENCOUNTER — Other Ambulatory Visit (HOSPITAL_COMMUNITY): Payer: Self-pay

## 2024-08-04 ENCOUNTER — Telehealth: Payer: Self-pay | Admitting: Cardiology

## 2024-08-04 NOTE — Telephone Encounter (Signed)
 Patient called stating when she went to pick up her script for eliquis  today it was going to cost her $500, she wants know if there is a coupon we can give her to use.

## 2024-08-04 NOTE — Telephone Encounter (Signed)
 Eliquis  is ready for pick up at [facility] for $45

## 2024-08-11 ENCOUNTER — Other Ambulatory Visit (HOSPITAL_COMMUNITY): Payer: Self-pay

## 2024-08-11 MED ORDER — IBANDRONATE SODIUM 150 MG PO TABS
150.0000 mg | ORAL_TABLET | ORAL | 1 refills | Status: AC
  Filled 2024-08-11 – 2025-01-12 (×3): qty 3, 90d supply, fill #0

## 2024-08-16 ENCOUNTER — Other Ambulatory Visit: Payer: Self-pay | Admitting: Family Medicine

## 2024-08-16 DIAGNOSIS — Z1231 Encounter for screening mammogram for malignant neoplasm of breast: Secondary | ICD-10-CM

## 2024-08-17 ENCOUNTER — Inpatient Hospital Stay
Admission: RE | Admit: 2024-08-17 | Discharge: 2024-08-17 | Discharge status: Home / Self Care | Source: Ambulatory Visit | Attending: Family Medicine | Admitting: Family Medicine

## 2024-08-17 DIAGNOSIS — Z1231 Encounter for screening mammogram for malignant neoplasm of breast: Secondary | ICD-10-CM

## 2024-08-18 ENCOUNTER — Ambulatory Visit: Attending: Cardiology | Admitting: Cardiology

## 2024-08-18 ENCOUNTER — Encounter: Payer: Self-pay | Admitting: Cardiology

## 2024-08-18 VITALS — BP 126/60 | HR 61 | Ht 64.0 in | Wt 157.0 lb

## 2024-08-18 DIAGNOSIS — I10 Essential (primary) hypertension: Secondary | ICD-10-CM

## 2024-08-18 DIAGNOSIS — I5022 Chronic systolic (congestive) heart failure: Secondary | ICD-10-CM

## 2024-08-18 DIAGNOSIS — D6869 Other thrombophilia: Secondary | ICD-10-CM

## 2024-08-18 DIAGNOSIS — I4819 Other persistent atrial fibrillation: Secondary | ICD-10-CM | POA: Diagnosis not present

## 2024-08-18 DIAGNOSIS — R000 Tachycardia, unspecified: Secondary | ICD-10-CM

## 2024-08-18 NOTE — Progress Notes (Signed)
 Electrophysiology Office Note:   Date:  08/18/2024  ID:  Jeanne Wilson, DOB February 17, 1951, MRN [id number]  Primary Cardiologist: Redell Shallow, MD Primary Heart Failure: None Electrophysiologist: Idara Woodside Gladis Norton, MD      History of Present Illness:   Jeanne Wilson is a 73 y.o. female with h/o atrial fibrillation, hypertension seen today for new consult for atrial fibrillation from [facility].  She presented to the hospital 06/03/2024 after an episode of central chest pain radiating to her arm.  She had shortness of breath and palpitations.  Upon EMS arrival, she was found to be in a wide-complex tachycardia with heart rates of about 180.  She was given adenosine  without change in her rhythm.  In the emergency room, she became hypotensive, lightheaded, dizzy.  She was cardioverted.  Post cardioversion, she was found to be in atrial fibrillation with controlled rates of 80s to 90s.  She had a left heart catheterization that showed no obstructive coronary artery disease.  She had an echo that showed an ejection fraction of 45 to 50%.  She does have the years.  In March of this year, she had an increase in her palpitations and fluttering.  Episodes are usually mild and brief.  In May she noted that she was much more fatigued than usual.  She began to have orthopnea.  The night of admission, she woke up to went to the kitchen to get a snack.  She sat down to eat and had a pounding in her chest.  She felt diaphoretic and nauseous.  Since last being seen in our clinic the patient reports doing well since her hospitalization.  She has had no further arrhythmias.  She is on amiodarone  without palpitations, weakness, fatigue, shortness of breath.  she denies chest pain, palpitations, dyspnea, PND, orthopnea, nausea, vomiting, dizziness, syncope, edema, weight gain, or early satiety.   Review of systems complete and found to be negative unless listed in HPI.   EP Information / Studies Reviewed:    EKG  is not ordered today. EKG from 07/14/2024 reviewed which showed sinus rhythm      Risk Assessment/Calculations:    CHA2DS2-VASc Score = 4   This indicates a 4.8% annual risk of stroke. The patient's score is based upon: CHF History: 1 HTN History: 1 Diabetes History: 0 Stroke History: 0 Vascular Disease History: 0 Age Score: 1 Gender Score: 1            Physical Exam:   VS:  BP 126/60   Pulse 61   Ht 5' 4 (1.626 m)   Wt 157 lb (71.2 kg)   SpO2 98%   BMI 26.95 kg/m    Wt Readings from Last 3 Encounters:  08/18/24 157 lb (71.2 kg)  07/14/24 154 lb (69.9 kg)  06/16/24 158 lb 9.6 oz (71.9 kg)     GEN: Well nourished, well developed in no acute distress NECK: No JVD; No carotid bruits CARDIAC: Regular rate and rhythm, no murmurs, rubs, gallops RESPIRATORY:  Clear to auscultation without rales, wheezing or rhonchi  ABDOMEN: Soft, non-tender, non-distended EXTREMITIES:  No edema; No deformity   ASSESSMENT AND PLAN:    1.  Persistent atrial fibrillation: 94% burden on most recent cardiac monitor-personally reviewed.  On amiodarone  and carvedilol .  She would prefer amiodarone  to be a short-term medication.  She had symptomatic atrial fibrillation with a accompanying wide-complex tachycardia.  Jeanne Wilson plan for ablation.  Risk, benefits, and alternatives to EP study and radiofrequency/pulse field ablation for  afib were also discussed in detail today. These risks include but are not limited to stroke, bleeding, vascular damage, tamponade, perforation, damage to the esophagus, lungs, and other structures, pulmonary vein stenosis, worsening renal function, and death. The patient understands these risk and wishes to proceed.  We Jeanne Wilson therefore proceed with catheter ablation at the next available time.  Carto, ICE, anesthesia are requested for the procedure.  This patient Jeanne Wilson NOT require CT prior to ablation  2.  Secondary hypercoagulable state: On Eliquis   3.  Chronic systolic heart  failure: Has mildly reduced ejection fraction.  Possibly tachycardia mediated.  Plan per primary cardiology.  4.  Hypertension: Well-controlled  5.  Wide-complex tachycardia: Potentially related to aberrant atrial arrhythmia, though when she was cardioverted, heart rates were well-controlled in atrial fibrillation.  This is somewhat concerning for ventricular tachycardia.  May require cardiac MRI.  Follow up with Afib Clinic as usual post procedure  Signed, Jeanne Morandi Gladis Norton, MD

## 2024-08-18 NOTE — Patient Instructions (Signed)
 Medication Instructions:  Your physician recommends that you continue on your current medications as directed. Please refer to the Current Medication list given to you today.  *If you need a refill on your cardiac medications before your next appointment, please call your pharmacy*  Lab Work: Will need with in 30 days of procedure  You may go to any [facility] Location for your lab work:  [facility] - [id number] ([facility]) - [address] [phone] [address]  [place] - [address]   [place]  - [address]   [place] - [address] - [address] [employer]  - [address] [place] - [address] ([facility]   If you have labs (blood work) drawn today and your tests are completely normal, you will receive your results only by: [employer] (if you have MyChart)  If you have any lab test that is abnormal or we need to change your treatment, we will call you or send a MyChart message to review the results.  Testing/Procedures: Afib albation  Follow-Up: At [facility], you and your health needs are our priority.  As part of our continuing mission to provide you with exceptional heart care, we have created designated Provider Care Teams.  These Care Teams include your primary Cardiologist (physician) and Advanced Practice Providers (APPs -  Physician Assistants and Nurse Practitioners) who all work together to provide you with the care you need, when you need it.  We recommend signing up for the patient portal called MyChart.  Sign up information is provided on this After Visit Summary.  MyChart is used to connect with patients for Virtual Visits (Telemedicine).  Patients are able to view lab/test results, encounter notes, upcoming appointments, etc.  Non-urgent messages can be sent to your provider as well.   To learn more about what you can do with  MyChart, go to [email or web address].    Your next appointment:   We will call you to schedule

## 2024-08-30 DIAGNOSIS — K080 Exfoliation of teeth due to systemic causes: Secondary | ICD-10-CM | POA: Diagnosis not present

## 2024-09-05 ENCOUNTER — Other Ambulatory Visit (HOSPITAL_COMMUNITY): Payer: Self-pay

## 2024-09-06 ENCOUNTER — Other Ambulatory Visit (HOSPITAL_COMMUNITY): Payer: Self-pay

## 2024-09-12 ENCOUNTER — Telehealth: Payer: Self-pay | Admitting: *Deleted

## 2024-09-12 DIAGNOSIS — Z01812 Encounter for preprocedural laboratory examination: Secondary | ICD-10-CM

## 2024-09-12 DIAGNOSIS — I4819 Other persistent atrial fibrillation: Secondary | ICD-10-CM

## 2024-09-12 NOTE — Telephone Encounter (Signed)
Left message to call back to schedule afib ablation.

## 2024-09-15 NOTE — Telephone Encounter (Signed)
 Pt returning nurses call from 9/15 regarding Ablation. Please advise

## 2024-09-15 NOTE — Progress Notes (Signed)
 HPI: FU atrial fibrillation. Stress test 2010 with question of lateral wall ischemia; cath showed normal cors and normal EF. Echocardiogram June 2025 showed ejection fraction 45 to 50%, mild left ventricular enlargement, mild left atrial enlargement, mild mitral regurgitation.  Cardiac catheterization June 2025 showed normal coronary arteries.  Patient was admitted June 2025 with wide-complex tachycardia.  It was felt this was likely secondary to atrial fibrillation with aberrancy.  She did undergo cardioversion due to hypotension.  She was treated with amiodarone .  Monitor July 2025 showed atrial fibrillation (94% burden) heart rate controlled.  Since last seen, she occasionally has chest tightness when lying flat at night but denies exertional chest pain.  Occasional dyspnea not related to activities.  No syncope.  No bleeding.  Current Outpatient Medications  Medication Sig Dispense Refill   acetaminophen  (TYLENOL ) 500 MG tablet Take 500-1,000 mg by mouth every 6 (six) hours as needed (pain.).     amiodarone  (PACERONE ) 200 MG tablet Take 1 tablet twice daily for 2 weeks, then take 1 tablet daily thereafter (Patient taking differently: Take 200 mg by mouth in the morning.) 60 tablet 2   apixaban  (ELIQUIS ) 5 MG TABS tablet Take 1 tablet (5 mg total) by mouth 2 (two) times daily. 60 tablet 11   atorvastatin  (LIPITOR) 10 MG tablet Take 10 mg by mouth every morning.     CALCIUM -MAGNESIUM-ZINC PO Take 1 capsule by mouth daily in the afternoon.     carvedilol  (COREG ) 3.125 MG tablet Take 1 tablet (3.125 mg total) by mouth 2 (two) times daily with a meal. 60 tablet 11   Glucosamine HCl (GLUCOSAMINE PO) Take 2 tablets by mouth daily in the afternoon.     ibandronate  (BONIVA ) 150 MG tablet Take 1 tablet (150 mg total) by mouth  every 30 (thirty) days. Take 60 minutes before first food in the morning on an empty stomach. Take with a full glass of water and do not take anything else by mouth or lie down  for the next 30 min. 12 tablet 1   losartan  (COZAAR ) 25 MG tablet Take 1 tablet (25 mg total) by mouth daily. (Patient taking differently: Take 25 mg by mouth daily at 12 noon.) 90 tablet 1   nitroGLYCERIN  (NITROSTAT ) 0.4 MG SL tablet Place 1 tablet (0.4 mg total) under the tongue every 5 (five) minutes as needed. 25 tablet 2   zolpidem  (AMBIEN ) 5 MG tablet Take 5 mg by mouth at bedtime.      ibandronate  (BONIVA ) 150 MG tablet Take 150 mg by mouth every 30 (thirty) days. Take in the morning with a full glass of water, on an empty stomach, and do not take anything else by mouth or lie down for the next 30 min.      No current facility-administered medications for this visit.     Past Medical History:  Diagnosis Date   Chest pain    with abnormal EKG. cardiac cath 12/06- normal coronaries, normal EF. 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   LEFT HEART CATH AND CORONARY ANGIOGRAPHY N/A 06/03/2024   Procedure: LEFT HEART CATH AND CORONARY ANGIOGRAPHY;  Surgeon: Elmira Newman PARAS, MD;  Location: [facility];  Service: Cardiovascular;  Laterality: N/A;    Social  History   Socioeconomic History   Marital status: Married    Spouse name: Not on file   Number of children: Not on file   Years of education: Not on file   Highest education level: Not on file  Occupational History   Not on file  Tobacco Use   Smoking status: Never   Smokeless tobacco: Never  Substance and Sexual Activity   Alcohol use: No   Drug use: No   Sexual activity: Not on file  Other Topics Concern   Not on file  Social History Narrative   Married, homemaker. Is active in yoga and low-impact aerobics.    Social Drivers of Corporate investment banker Strain: Not on file  Food Insecurity: No Food Insecurity (06/03/2024)   Hunger  Vital Sign    Worried About Running Out of Food in the Last Year: Never true    Ran Out of Food in the Last Year: Never true  Transportation Needs: No Transportation Needs (06/03/2024)   PRAPARE - Administrator, Civil Service (Medical): No    Lack of Transportation (Non-Medical): No  Physical Activity: Not on file  Stress: Not on file  Social Connections: Socially Integrated (06/03/2024)   Social Connection and Isolation Panel    Frequency of Communication with Friends and Family: Twice a week    Frequency of Social Gatherings with Friends and Family: Twice a week    Attends Religious Services: 1 to 4 times per year    Active Member of [employer] or Organizations: No    Attends Engineer, structural: 1 to 4 times per year    Marital Status: Married  Catering manager Violence: Not At Risk (06/03/2024)   Humiliation, Afraid, Rape, and Kick questionnaire    Fear of Current or Ex-Partner: No    Emotionally Abused: No    Physically Abused: No    Sexually Abused: No    Family History  Problem Relation Age of Onset   Hypertension Mother    BRCA 1/2 Neg Hx    Breast cancer Neg Hx     ROS: no fevers or chills, productive cough, hemoptysis, dysphasia, odynophagia, melena, hematochezia, dysuria, hematuria, rash, seizure activity, orthopnea, PND, pedal edema, claudication. Remaining systems are negative.  Physical Exam: Well-developed well-nourished in no acute distress.  Skin is warm and dry.  HEENT is normal.  Neck is supple.  Chest is clear to auscultation with normal expansion.  Cardiovascular exam is regular rate and rhythm.  Abdominal exam nontender or distended. No masses palpated. Extremities show no edema. neuro grossly intact  A/P  1 persistent atrial fibrillation-patient was noted to have 94% burden on recent monitor.  She is in sinus rhythm today.  Will continue amiodarone , carvedilol  and apixaban .  She has been seen by Dr. Inocencio and ablation has been  scheduled.  2 cardiomyopathy-LV function is mildly reduced on most recent echocardiogram.  Question tachycardia mediated.  Continue carvedilol  and losartan .  Will plan to repeat echocardiogram in 3 months to reassess.  3 hypertension-blood pressure is controlled.  Continue present medications.  4 hyperlipidemia-continue statin.  Redell Shallow, MD

## 2024-09-16 NOTE — Telephone Encounter (Signed)
 Left message to call back.

## 2024-09-19 DIAGNOSIS — I10 Essential (primary) hypertension: Secondary | ICD-10-CM | POA: Diagnosis not present

## 2024-09-19 DIAGNOSIS — I4891 Unspecified atrial fibrillation: Secondary | ICD-10-CM | POA: Diagnosis not present

## 2024-09-22 NOTE — Telephone Encounter (Signed)
 Pt was returning nurse call regarding ablation and is requesting a callback. Please advise

## 2024-09-24 ENCOUNTER — Encounter: Payer: Self-pay | Admitting: Cardiology

## 2024-09-26 ENCOUNTER — Encounter: Payer: Self-pay | Admitting: Cardiology

## 2024-09-26 ENCOUNTER — Telehealth: Payer: Self-pay | Admitting: *Deleted

## 2024-09-26 ENCOUNTER — Other Ambulatory Visit (HOSPITAL_COMMUNITY): Payer: Self-pay

## 2024-09-26 ENCOUNTER — Ambulatory Visit: Attending: Cardiology | Admitting: Cardiology

## 2024-09-26 VITALS — BP 132/60 | HR 69 | Ht 64.0 in | Wt 159.0 lb

## 2024-09-26 DIAGNOSIS — I480 Paroxysmal atrial fibrillation: Secondary | ICD-10-CM

## 2024-09-26 DIAGNOSIS — E7800 Pure hypercholesterolemia, unspecified: Secondary | ICD-10-CM

## 2024-09-26 DIAGNOSIS — I10 Essential (primary) hypertension: Secondary | ICD-10-CM

## 2024-09-26 DIAGNOSIS — R000 Tachycardia, unspecified: Secondary | ICD-10-CM

## 2024-09-26 DIAGNOSIS — I420 Dilated cardiomyopathy: Secondary | ICD-10-CM | POA: Diagnosis not present

## 2024-09-26 MED ORDER — NITROGLYCERIN 0.4 MG SL SUBL
0.4000 mg | SUBLINGUAL_TABLET | SUBLINGUAL | 6 refills | Status: AC | PRN
  Filled 2024-09-26: qty 25, 10d supply, fill #0
  Filled 2024-10-06: qty 25, 10d supply, fill #1
  Filled 2024-11-06: qty 25, 10d supply, fill #2

## 2024-09-26 NOTE — Patient Instructions (Addendum)
   Testing/Procedures:  Your physician has requested that you have an echocardiogram. Echocardiography is a painless test that uses sound waves to create images of your heart. It provides your doctor with information about the size and shape of your heart and how well your heart's chambers and valves are working. This procedure takes approximately one hour. There are no restrictions for this procedure. Please do NOT wear cologne, perfume, aftershave, or lotions (deodorant is allowed). Please arrive 15 minutes prior to your appointment time.  Please note: We ask at that you not bring children with you during ultrasound (echo/ vascular) testing. Due to room size and safety concerns, children are not allowed in the ultrasound rooms during exams. Our front office staff cannot provide observation of children in our lobby area while testing is being conducted. An adult accompanying a patient to their appointment will only be allowed in the ultrasound room at the discretion of the ultrasound technician under special circumstances. We apologize for any inconvenience. [address]-SCHEDULE IN 3 MONTHS  Follow-Up: At [facility], you and your health needs are our priority.  As part of our continuing mission to provide you with exceptional heart care, our providers are all part of one team.  This team includes your primary Cardiologist (physician) and Advanced Practice Providers or APPs (Physician Assistants and Nurse Practitioners) who all work together to provide you with the care you need, when you need it.  Your next appointment:   6 month(s)  Provider:   Redell Shallow, MD

## 2024-09-26 NOTE — Telephone Encounter (Signed)
 Pt made aware Dr. Inocencio recommends a cardiac MRI. Aware someone will contact her to arrange this testing. Aware I will get instructions sent via mychart. Patient verbalized understanding and agreeable to plan.

## 2024-09-26 NOTE — Telephone Encounter (Signed)
 Ablation scheduled for 12/30.  Aware office will be in touch with instructions.  Patient verbalized understanding and agreeable to plan.

## 2024-09-26 NOTE — Telephone Encounter (Signed)
 Left message to call back.

## 2024-09-27 ENCOUNTER — Telehealth: Payer: Self-pay

## 2024-09-27 DIAGNOSIS — Z01812 Encounter for preprocedural laboratory examination: Secondary | ICD-10-CM

## 2024-09-27 DIAGNOSIS — I10 Essential (primary) hypertension: Secondary | ICD-10-CM | POA: Diagnosis not present

## 2024-09-27 DIAGNOSIS — I480 Paroxysmal atrial fibrillation: Secondary | ICD-10-CM

## 2024-09-27 DIAGNOSIS — I4891 Unspecified atrial fibrillation: Secondary | ICD-10-CM | POA: Diagnosis not present

## 2024-09-27 DIAGNOSIS — M810 Age-related osteoporosis without current pathological fracture: Secondary | ICD-10-CM | POA: Diagnosis not present

## 2024-09-27 NOTE — Telephone Encounter (Signed)
 Pt scheduled on 12/30 with Dr. Inocencio.

## 2024-09-29 NOTE — Telephone Encounter (Signed)
 Instructions sent via [employer]

## 2024-09-30 NOTE — Addendum Note (Signed)
 Addended by: GRETEL MAEOLA CROME on: 09/30/2024 08:52 AM   Modules accepted: Orders

## 2024-10-06 ENCOUNTER — Other Ambulatory Visit (HOSPITAL_COMMUNITY): Payer: Self-pay

## 2024-10-06 ENCOUNTER — Other Ambulatory Visit: Payer: Self-pay | Admitting: Cardiology

## 2024-10-06 MED ORDER — ZOLPIDEM TARTRATE 5 MG PO TABS
5.0000 mg | ORAL_TABLET | Freq: Every evening | ORAL | 2 refills | Status: DC | PRN
  Filled 2024-10-14: qty 30, 30d supply, fill #0

## 2024-10-06 MED ORDER — AMIODARONE HCL 200 MG PO TABS
ORAL_TABLET | ORAL | 3 refills | Status: AC
  Filled 2024-10-06: qty 90, 76d supply, fill #0
  Filled 2024-11-06: qty 90, 90d supply, fill #0
  Filled 2025-02-03: qty 90, 90d supply, fill #1

## 2024-10-10 ENCOUNTER — Other Ambulatory Visit (HOSPITAL_COMMUNITY): Payer: Self-pay

## 2024-10-12 ENCOUNTER — Ambulatory Visit (HOSPITAL_COMMUNITY)
Admission: RE | Admit: 2024-10-12 | Discharge: 2024-10-12 | Disposition: A | Discharge status: Home / Self Care | Source: Ambulatory Visit | Attending: Cardiology | Admitting: Cardiology

## 2024-10-12 ENCOUNTER — Other Ambulatory Visit: Payer: Self-pay | Admitting: Cardiology

## 2024-10-12 DIAGNOSIS — R000 Tachycardia, unspecified: Secondary | ICD-10-CM | POA: Insufficient documentation

## 2024-10-12 DIAGNOSIS — I480 Paroxysmal atrial fibrillation: Secondary | ICD-10-CM | POA: Insufficient documentation

## 2024-10-12 MED ORDER — GADOBUTROL 1 MMOL/ML IV SOLN
10.0000 mL | Freq: Once | INTRAVENOUS | Status: AC | PRN
  Administered 2024-10-12: 10 mL via INTRAVENOUS

## 2024-10-14 ENCOUNTER — Other Ambulatory Visit (HOSPITAL_COMMUNITY): Payer: Self-pay

## 2024-10-14 ENCOUNTER — Other Ambulatory Visit: Payer: Self-pay

## 2024-10-18 DIAGNOSIS — I4891 Unspecified atrial fibrillation: Secondary | ICD-10-CM | POA: Diagnosis not present

## 2024-10-18 DIAGNOSIS — I10 Essential (primary) hypertension: Secondary | ICD-10-CM | POA: Diagnosis not present

## 2024-10-20 DIAGNOSIS — I4891 Unspecified atrial fibrillation: Secondary | ICD-10-CM | POA: Diagnosis not present

## 2024-10-20 DIAGNOSIS — Z1331 Encounter for screening for depression: Secondary | ICD-10-CM | POA: Diagnosis not present

## 2024-10-20 DIAGNOSIS — Z0000 Encounter for general adult medical examination without abnormal findings: Secondary | ICD-10-CM | POA: Diagnosis not present

## 2024-10-20 DIAGNOSIS — I10 Essential (primary) hypertension: Secondary | ICD-10-CM | POA: Diagnosis not present

## 2024-10-20 DIAGNOSIS — I341 Nonrheumatic mitral (valve) prolapse: Secondary | ICD-10-CM | POA: Diagnosis not present

## 2024-10-20 DIAGNOSIS — M810 Age-related osteoporosis without current pathological fracture: Secondary | ICD-10-CM | POA: Diagnosis not present

## 2024-10-26 ENCOUNTER — Ambulatory Visit: Payer: Self-pay | Admitting: Cardiology

## 2024-10-28 DIAGNOSIS — I10 Essential (primary) hypertension: Secondary | ICD-10-CM | POA: Diagnosis not present

## 2024-10-28 DIAGNOSIS — I4891 Unspecified atrial fibrillation: Secondary | ICD-10-CM | POA: Diagnosis not present

## 2024-10-28 DIAGNOSIS — M810 Age-related osteoporosis without current pathological fracture: Secondary | ICD-10-CM | POA: Diagnosis not present

## 2024-11-07 ENCOUNTER — Other Ambulatory Visit (HOSPITAL_COMMUNITY): Payer: Self-pay

## 2024-11-08 ENCOUNTER — Other Ambulatory Visit (HOSPITAL_COMMUNITY): Payer: Self-pay

## 2024-11-08 ENCOUNTER — Other Ambulatory Visit: Payer: Self-pay | Admitting: Physician Assistant

## 2024-11-11 ENCOUNTER — Other Ambulatory Visit (HOSPITAL_COMMUNITY): Payer: Self-pay

## 2024-11-14 ENCOUNTER — Other Ambulatory Visit (HOSPITAL_COMMUNITY): Payer: Self-pay

## 2024-11-14 MED ORDER — ZOLPIDEM TARTRATE 5 MG PO TABS
5.0000 mg | ORAL_TABLET | Freq: Every day | ORAL | 5 refills | Status: AC
  Filled 2024-11-14: qty 30, 30d supply, fill #0
  Filled 2024-12-11 – 2024-12-13 (×2): qty 30, 30d supply, fill #1
  Filled 2025-01-12: qty 30, 30d supply, fill #2

## 2024-11-15 ENCOUNTER — Encounter (HOSPITAL_COMMUNITY): Payer: Self-pay

## 2024-11-15 ENCOUNTER — Other Ambulatory Visit (HOSPITAL_COMMUNITY): Payer: Self-pay

## 2024-11-17 DIAGNOSIS — I10 Essential (primary) hypertension: Secondary | ICD-10-CM | POA: Diagnosis not present

## 2024-11-17 DIAGNOSIS — I4891 Unspecified atrial fibrillation: Secondary | ICD-10-CM | POA: Diagnosis not present

## 2024-11-22 ENCOUNTER — Telehealth: Payer: Self-pay

## 2024-11-22 NOTE — Telephone Encounter (Signed)
-----   Message from Nurse Sherri P sent at 09/30/2024  8:44 AM EDT ----- Regarding: 12/27/24 AFib ablation Precert:  MD: Camnitz Type of ablation: A-fib Diagnosis: tachycardia CPT code: A-fib (06343) Ablation scheduled (date/time): 12/27/24  10:00 am  Procedure:  Added to calendar? Yes Orders entered? Yes Letter complete? No, >30 days before procedure Scheduled with cath lab? Yes Any medications to hold? Yes (please list hold instructions): routine Labs ordered (CBC, BMET, PT/INR if on warfarin): Yes Mapping system: Doesn't matter CARTO/OPAL rep notified? No Cardiac CT needed? No Dye allergy? N/a Pre-meds ordered and instructions given? N/a Letter method: MyChart H&P: 08/18/24 Device: No  Follow-up:  Cassie/Angel, please schedule Routine.

## 2024-11-27 DIAGNOSIS — I10 Essential (primary) hypertension: Secondary | ICD-10-CM | POA: Diagnosis not present

## 2024-11-27 DIAGNOSIS — I4891 Unspecified atrial fibrillation: Secondary | ICD-10-CM | POA: Diagnosis not present

## 2024-11-27 DIAGNOSIS — M810 Age-related osteoporosis without current pathological fracture: Secondary | ICD-10-CM | POA: Diagnosis not present

## 2024-11-29 ENCOUNTER — Ambulatory Visit: Payer: Self-pay | Admitting: Cardiology

## 2024-11-29 ENCOUNTER — Ambulatory Visit (HOSPITAL_COMMUNITY)
Admission: RE | Admit: 2024-11-29 | Discharge: 2024-11-29 | Disposition: A | Source: Ambulatory Visit | Attending: Cardiology | Admitting: Cardiology

## 2024-11-29 DIAGNOSIS — I420 Dilated cardiomyopathy: Secondary | ICD-10-CM | POA: Insufficient documentation

## 2024-11-29 DIAGNOSIS — I340 Nonrheumatic mitral (valve) insufficiency: Secondary | ICD-10-CM | POA: Diagnosis not present

## 2024-11-29 LAB — ECHOCARDIOGRAM COMPLETE
MV M vel: 4.97 m/s
MV Peak grad: 98.8 mmHg
Radius: 0.4 cm
S' Lateral: 3.1 cm

## 2024-12-01 ENCOUNTER — Encounter (HOSPITAL_COMMUNITY): Payer: Self-pay

## 2024-12-06 ENCOUNTER — Telehealth (HOSPITAL_COMMUNITY): Payer: Self-pay

## 2024-12-06 NOTE — Telephone Encounter (Signed)
 Spoke with patient to discuss upcoming procedure.   CT: not required.  Labs: completed.   Any recent signs of acute illness or been started on antibiotics? No Any new medications started?No Any medications to hold?  No Any missed doses of blood thinner?  No Advised patient to continue taking Eliquis  (Apixaban ) twice daily without missing any doses.  Medication instructions:  On the morning of your procedure DO NOT take any medication., including Eliquis  (Apixaban ) or the procedure may be rescheduled. Nothing to eat or drink after midnight prior to your procedure.  Confirmed patient is scheduled for Atrial Fibrillation Ablation on Tuesday, December 30 with Dr. Inocencio. Instructed patient to arrive at the Main Entrance A at [facility]: [address] [place], [place] [address] and check in at Admitting at 7:30 AM.   Plan to go home the same day, you will only stay overnight if medically necessary. You MUST have a responsible adult to drive you home and MUST be with you the first 24 hours after you arrive home or your procedure could be cancelled.  Informed patient a nurse will call a day before the procedure to confirm arrival time and ensure instructions are followed.  Patient verbalized understanding to all instructions provided and agreed to proceed with procedure.   Advised patient to contact RN Navigator at [phone], to inform of any new medications started after call or concerns prior to procedure.

## 2024-12-08 ENCOUNTER — Other Ambulatory Visit (HOSPITAL_COMMUNITY): Payer: Self-pay

## 2024-12-08 DIAGNOSIS — Z01812 Encounter for preprocedural laboratory examination: Secondary | ICD-10-CM | POA: Diagnosis not present

## 2024-12-08 DIAGNOSIS — I480 Paroxysmal atrial fibrillation: Secondary | ICD-10-CM | POA: Diagnosis not present

## 2024-12-08 LAB — BASIC METABOLIC PANEL WITH GFR
BUN/Creatinine Ratio: 19 (ref 12–28)
BUN: 13 mg/dL (ref 8–27)
CO2: 24 mmol/L (ref 20–29)
Calcium: 9.6 mg/dL (ref 8.7–10.3)
Chloride: 104 mmol/L (ref 96–106)
Creatinine, Ser: 0.7 mg/dL (ref 0.57–1.00)
Glucose: 109 mg/dL — ABNORMAL HIGH (ref 70–99)
Potassium: 4.7 mmol/L (ref 3.5–5.2)
Sodium: 141 mmol/L (ref 134–144)
eGFR: 92 mL/min/1.73 (ref >59)

## 2024-12-08 LAB — CBC
Hematocrit: 42.1 % (ref 34.0–46.6)
Hemoglobin: 13.4 g/dL (ref 11.1–15.9)
MCH: 31.8 pg (ref 26.6–33.0)
MCHC: 31.8 g/dL (ref 31.5–35.7)
MCV: 100 fL — ABNORMAL HIGH (ref 79–97)
Platelets: 259 x10E3/uL (ref 150–450)
RBC: 4.22 x10E6/uL (ref 3.77–5.28)
RDW: 11.8 % (ref 11.7–15.4)
WBC: 8.2 x10E3/uL (ref 3.4–10.8)

## 2024-12-12 ENCOUNTER — Other Ambulatory Visit: Payer: Self-pay

## 2024-12-12 ENCOUNTER — Other Ambulatory Visit (HOSPITAL_COMMUNITY): Payer: Self-pay

## 2024-12-14 DIAGNOSIS — H40033 Anatomical narrow angle, bilateral: Secondary | ICD-10-CM | POA: Diagnosis not present

## 2024-12-14 DIAGNOSIS — H2513 Age-related nuclear cataract, bilateral: Secondary | ICD-10-CM | POA: Diagnosis not present

## 2024-12-26 NOTE — Pre-Procedure Instructions (Signed)
 Instructed patient on the following items: Arrival time 0730 Nothing to eat or drink after midnight No meds AM of procedure Responsible person to drive you home and stay with you for 24 hrs  Have you missed any doses of anti-coagulant Eliquis - takes twice a day, hasn't missed any doses in last 4 weeks.  Don't take dose morning of procedure.

## 2024-12-27 ENCOUNTER — Ambulatory Visit (HOSPITAL_COMMUNITY): Payer: Self-pay | Admitting: Anesthesiology

## 2024-12-27 ENCOUNTER — Ambulatory Visit (HOSPITAL_COMMUNITY)
Admission: RE | Disposition: A | Discharge status: Home / Self Care | Payer: Self-pay | Source: Home / Self Care | Attending: Cardiology

## 2024-12-27 ENCOUNTER — Ambulatory Visit (HOSPITAL_COMMUNITY)
Admission: RE | Admit: 2024-12-27 | Discharge: 2024-12-27 | Disposition: A | Discharge status: Home / Self Care | Attending: Cardiology | Admitting: Cardiology

## 2024-12-27 ENCOUNTER — Other Ambulatory Visit: Payer: Self-pay

## 2024-12-27 DIAGNOSIS — I110 Hypertensive heart disease with heart failure: Secondary | ICD-10-CM | POA: Diagnosis not present

## 2024-12-27 DIAGNOSIS — Z7901 Long term (current) use of anticoagulants: Secondary | ICD-10-CM | POA: Insufficient documentation

## 2024-12-27 DIAGNOSIS — I4819 Other persistent atrial fibrillation: Secondary | ICD-10-CM

## 2024-12-27 DIAGNOSIS — I252 Old myocardial infarction: Secondary | ICD-10-CM | POA: Insufficient documentation

## 2024-12-27 DIAGNOSIS — I10 Essential (primary) hypertension: Secondary | ICD-10-CM

## 2024-12-27 DIAGNOSIS — I4891 Unspecified atrial fibrillation: Secondary | ICD-10-CM

## 2024-12-27 DIAGNOSIS — I509 Heart failure, unspecified: Secondary | ICD-10-CM | POA: Insufficient documentation

## 2024-12-27 HISTORY — PX: ATRIAL FIBRILLATION ABLATION: EP1191

## 2024-12-27 LAB — POCT ACTIVATED CLOTTING TIME: Activated Clotting Time: 353 s

## 2024-12-27 SURGERY — ATRIAL FIBRILLATION ABLATION
Anesthesia: General

## 2024-12-27 MED ORDER — HEPARIN SODIUM (PORCINE) 1000 UNIT/ML IJ SOLN
INTRAMUSCULAR | Status: AC
  Filled 2024-12-27: qty 10

## 2024-12-27 MED ORDER — ROCURONIUM BROMIDE 10 MG/ML (PF) SYRINGE
PREFILLED_SYRINGE | INTRAVENOUS | Status: DC | PRN
  Administered 2024-12-27: 50 mg via INTRAVENOUS

## 2024-12-27 MED ORDER — SUGAMMADEX SODIUM 200 MG/2ML IV SOLN
INTRAVENOUS | Status: DC | PRN
  Administered 2024-12-27: 200 mg via INTRAVENOUS

## 2024-12-27 MED ORDER — LIDOCAINE 2% (20 MG/ML) 5 ML SYRINGE
INTRAMUSCULAR | Status: DC | PRN
  Administered 2024-12-27: 80 mg via INTRAVENOUS

## 2024-12-27 MED ORDER — HEPARIN SODIUM (PORCINE) 1000 UNIT/ML IJ SOLN
INTRAMUSCULAR | Status: DC | PRN
  Administered 2024-12-27: 2000 [IU] via INTRAVENOUS
  Administered 2024-12-27: 14000 [IU] via INTRAVENOUS

## 2024-12-27 MED ORDER — EPHEDRINE SULFATE-NACL 50-0.9 MG/10ML-% IV SOSY
PREFILLED_SYRINGE | INTRAVENOUS | Status: DC | PRN
  Administered 2024-12-27: 5 mg via INTRAVENOUS

## 2024-12-27 MED ORDER — MIDAZOLAM HCL (PF) 2 MG/2ML IJ SOLN
INTRAMUSCULAR | Status: DC | PRN
  Administered 2024-12-27: 2 mg via INTRAVENOUS

## 2024-12-27 MED ORDER — PROTAMINE SULFATE 10 MG/ML IV SOLN
INTRAVENOUS | Status: DC | PRN
  Administered 2024-12-27: 40 mg via INTRAVENOUS

## 2024-12-27 MED ORDER — SODIUM CHLORIDE 0.9 % IV SOLN: INTRAVENOUS | Status: DC

## 2024-12-27 MED ORDER — FENTANYL CITRATE (PF) 100 MCG/2ML IJ SOLN
INTRAMUSCULAR | Status: AC
  Filled 2024-12-27: qty 2

## 2024-12-27 MED ORDER — PROPOFOL 10 MG/ML IV BOLUS
INTRAVENOUS | Status: DC | PRN
  Administered 2024-12-27: 150 mg via INTRAVENOUS

## 2024-12-27 MED ORDER — HEPARIN (PORCINE) IN NACL 1000-0.9 UT/500ML-% IV SOLN
INTRAVENOUS | Status: DC | PRN
  Administered 2024-12-27 (×2): 500 mL

## 2024-12-27 MED ORDER — SODIUM CHLORIDE 0.9 % IV SOLN: 250.0000 mL | INTRAVENOUS | Status: DC | PRN

## 2024-12-27 MED ORDER — DEXAMETHASONE SOD PHOSPHATE PF 10 MG/ML IJ SOLN
INTRAMUSCULAR | Status: DC | PRN
  Administered 2024-12-27: 10 mg via INTRAVENOUS

## 2024-12-27 MED ORDER — ATROPINE SULFATE 1 MG/ML IV SOLN
INTRAVENOUS | Status: DC | PRN
  Administered 2024-12-27: 1 mg via INTRAVENOUS

## 2024-12-27 MED ORDER — ONDANSETRON HCL 4 MG/2ML IJ SOLN
INTRAMUSCULAR | Status: DC | PRN
  Administered 2024-12-27: 4 mg via INTRAVENOUS

## 2024-12-27 MED ORDER — PHENYLEPHRINE HCL-NACL 20-0.9 MG/250ML-% IV SOLN
INTRAVENOUS | Status: DC | PRN
  Administered 2024-12-27: 25 ug/min via INTRAVENOUS

## 2024-12-27 MED ORDER — SODIUM CHLORIDE 0.9% FLUSH: 3.0000 mL | Freq: Two times a day (BID) | INTRAVENOUS | Status: DC

## 2024-12-27 MED ORDER — MIDAZOLAM HCL 2 MG/2ML IJ SOLN
INTRAMUSCULAR | Status: AC
  Filled 2024-12-27: qty 2

## 2024-12-27 MED ORDER — HEPARIN SODIUM (PORCINE) 1000 UNIT/ML IJ SOLN
INTRAMUSCULAR | Status: DC | PRN
  Administered 2024-12-27: 1000 [IU] via INTRAVENOUS

## 2024-12-27 MED ORDER — FENTANYL CITRATE (PF) 250 MCG/5ML IJ SOLN
INTRAMUSCULAR | Status: DC | PRN
  Administered 2024-12-27 (×2): 50 ug via INTRAVENOUS

## 2024-12-27 MED ORDER — ONDANSETRON HCL 4 MG/2ML IJ SOLN: 4.0000 mg | Freq: Four times a day (QID) | INTRAMUSCULAR | Status: DC | PRN

## 2024-12-27 MED ORDER — PHENYLEPHRINE 80 MCG/ML (10ML) SYRINGE FOR IV PUSH (FOR BLOOD PRESSURE SUPPORT)
PREFILLED_SYRINGE | INTRAVENOUS | Status: DC | PRN
  Administered 2024-12-27: 160 ug via INTRAVENOUS

## 2024-12-27 MED ORDER — ACETAMINOPHEN 325 MG PO TABS
650.0000 mg | ORAL_TABLET | ORAL | Status: DC | PRN
  Administered 2024-12-27: 650 mg via ORAL
  Filled 2024-12-27: qty 2

## 2024-12-27 MED ORDER — ACETAMINOPHEN 500 MG PO TABS
1000.0000 mg | ORAL_TABLET | Freq: Once | ORAL | Status: AC
  Administered 2024-12-27: 1000 mg via ORAL
  Filled 2024-12-27: qty 2

## 2024-12-27 MED ORDER — SODIUM CHLORIDE 0.9% FLUSH: 3.0000 mL | INTRAVENOUS | Status: DC | PRN

## 2024-12-27 SURGICAL SUPPLY — 16 items
CABLE VARIPULSE STERILE (CATHETERS) IMPLANT
CATH DECANAV D CURVE (CATHETERS) IMPLANT
CATH GE 8FR SOUNDSTAR (CATHETERS) IMPLANT
CATHETER VARIPULSE 8.5FR (CATHETERS) IMPLANT
CLOSURE MYNX CONTROL 6F/7F (Vascular Products) IMPLANT
CLOSURE PERCLOSE PROSTYLE (Vascular Products) IMPLANT
COVER SWIFTLINK CONNECTOR (BAG) ×1 IMPLANT
KIT VERSACROSS 8.5F 63 45D 180 (KITS) IMPLANT
PACK EP LF (CUSTOM PROCEDURE TRAY) ×1 IMPLANT
PAD DEFIB RADIO PHYSIO CONN (PAD) ×1 IMPLANT
PATCH CARTO3 (PAD) IMPLANT
SHEATH CARTO VIZIGO SM CVD (SHEATH) IMPLANT
SHEATH PINNACLE 8F 10CM (SHEATH) IMPLANT
SHEATH PINNACLE 9F 10CM (SHEATH) IMPLANT
SHEATH PROBE COVER 6X72 (BAG) IMPLANT
TUBING SMART ABLATE COOLFLOW (TUBING) IMPLANT

## 2024-12-27 NOTE — Transfer of Care (Signed)
 Immediate Anesthesia Transfer of Care Note  Patient: Jeanne Wilson  Procedure(s) Performed: ATRIAL FIBRILLATION ABLATION  Patient Location: Cath Lab  Anesthesia Type:General  Level of Consciousness: drowsy  Airway & Oxygen Therapy: Patient Spontanous Breathing and Patient connected to face mask oxygen  Post-op Assessment: Report given to RN and Post -op Vital signs reviewed and stable  Post vital signs: Reviewed and stable  Last Vitals:  Vitals Value Taken Time  BP    Temp    Pulse    Resp    SpO2      Last Pain:  Vitals:   12/27/24 0823  TempSrc: Oral  PainSc:          Complications: There were no known notable events for this encounter.

## 2024-12-27 NOTE — Anesthesia Preprocedure Evaluation (Addendum)
"                                    Anesthesia Evaluation  Patient identified by MRN, date of birth, ID band Patient awake    Reviewed: Allergy & Precautions, NPO status , Patient's Chart, lab work & pertinent test results, reviewed documented beta blocker date and time   Airway Mallampati: III  TM Distance: >3 FB Neck ROM: Full    Dental  (+) Teeth Intact, Dental Advisory Given   Pulmonary neg pulmonary ROS   Pulmonary exam normal breath sounds clear to auscultation       Cardiovascular hypertension, Pt. on home beta blockers and Pt. on medications (-) angina + Past MI  Normal cardiovascular exam+ dysrhythmias Atrial Fibrillation  Rhythm:Regular Rate:Normal     Neuro/Psych negative neurological ROS     GI/Hepatic negative GI ROS, Neg liver ROS,,,  Endo/Other  negative endocrine ROS    Renal/GU negative Renal ROS     Musculoskeletal negative musculoskeletal ROS (+)    Abdominal   Peds  Hematology  (+) Blood dyscrasia (Eliquis )   Anesthesia Other Findings Day of surgery medications reviewed with the patient.  Reproductive/Obstetrics                              Anesthesia Physical Anesthesia Plan  ASA: 3  Anesthesia Plan: General   Post-op Pain Management: Tylenol  PO (pre-op)*   Induction: Intravenous  PONV Risk Score and Plan: 3 and Dexamethasone  and Ondansetron   Airway Management Planned: Oral ETT and Video Laryngoscope Planned  Additional Equipment:   Intra-op Plan:   Post-operative Plan: Extubation in OR  Informed Consent: I have reviewed the patients History and Physical, chart, labs and discussed the procedure including the risks, benefits and alternatives for the proposed anesthesia with the patient or authorized representative who has indicated his/her understanding and acceptance.     Dental advisory given  Plan Discussed with: CRNA  Anesthesia Plan Comments: (6.5 ETT)         Anesthesia  Quick Evaluation  "

## 2024-12-27 NOTE — Discharge Instructions (Signed)

## 2024-12-27 NOTE — Progress Notes (Signed)
 Discharge instructions reviewed with patient and husband by previous care nurse. Verified there were no outstanding questions or concerns regarding d/c instructions. PT tolerated PO intake. Ambulated in the hallway, was able to void without difficulty. Seen by MD. Incision site remains clean dry and intact. No s/s of complications. PT escorted from the unit via wheel chair to personal vehicle.

## 2024-12-27 NOTE — Progress Notes (Signed)
 Dr Shelton in and ok to d/c home

## 2024-12-27 NOTE — Progress Notes (Signed)
 Assumed care at 1330, pt to ambulate at 1430. Tele reviewed and Groin sites observed.

## 2024-12-27 NOTE — H&P (Addendum)
" °  Electrophysiology Office Note:   Date:  12/27/2024  ID:  Jeanne Wilson, DOB Mar 28, 1951, MRN [id number]  Primary Cardiologist: Redell Shallow, MD Primary Heart Failure: None Electrophysiologist: Jeron Grahn Gladis Norton, MD      History of Present Illness:   Jeanne Wilson is a 73 y.o. female with h/o atrial fibrillation, hypertension seen today for new consult for atrial fibrillation from [facility].  She presented to the hospital 06/03/2024 after an episode of central chest pain radiating to her arm.  She had shortness of breath and palpitations.  Upon EMS arrival, she was found to be in a wide-complex tachycardia with heart rates of about 180.  She was given adenosine  without change in her rhythm.  In the emergency room, she became hypotensive, lightheaded, dizzy.  She was cardioverted.  Post cardioversion, she was found to be in atrial fibrillation with controlled rates of 80s to 90s.  She had a left heart catheterization that showed no obstructive coronary artery disease.  She had an echo that showed an ejection fraction of 45 to 50%.  Today, denies symptoms of palpitations, chest pain, dyspnea, orthopnea, PND, lower extremity edema, claudication, dizziness, presyncope, syncope, bleeding, or neurologic sequela. The patient is tolerating medications without difficulties. Plan ablation today.   EP Information / Studies Reviewed:    EKG is not ordered today. EKG from 07/14/2024 reviewed which showed sinus rhythm      Risk Assessment/Calculations:    CHA2DS2-VASc Score = 4   This indicates a 4.8% annual risk of stroke. The patient's score is based upon: CHF History: 1 HTN History: 1 Diabetes History: 0 Stroke History: 0 Vascular Disease History: 0 Age Score: 1 Gender Score: 1            Physical Exam:   VS:  BP 138/72   Pulse 68   Temp 97.6 F (36.4 C) (Oral)   Resp 17   Ht 5' 4 (1.626 m)   Wt 70.8 kg   SpO2 95%   BMI 26.78 kg/m    Wt Readings from Last 3 Encounters:   12/27/24 70.8 kg  09/26/24 72.1 kg  08/18/24 71.2 kg    GEN: Well nourished, well developed in no acute distress NECK: No JVD; No carotid bruits CARDIAC: Regular rate and rhythm, no murmurs, rubs, gallops RESPIRATORY:  Clear to auscultation without rales, wheezing or rhonchi  ABDOMEN: Soft, non-tender, non-distended EXTREMITIES:  No edema; No deformity    ASSESSMENT AND PLAN:    1.  Persistent atrial fibrillation: [name] has presented today for surgery, with the diagnosis of AF.  The various methods of treatment have been discussed with the patient and family. After consideration of risks, benefits and other options for treatment, the patient has consented to  Procedure(s): Catheter ablation as a surgical intervention .  Risks include but not limited to complete heart block, stroke, esophageal damage, nerve damage, bleeding, vascular damage, tamponade, perforation, MI, and death. The patient's history has been reviewed, patient examined, no change in status, stable for surgery.  I have reviewed the patient's chart and labs.  Questions were answered to the patient's satisfaction.    Joelie Schou Norton, MD 12/27/2024 8:55 AM  "

## 2024-12-27 NOTE — Anesthesia Procedure Notes (Signed)
 Procedure Name: Intubation Date/Time: 12/27/2024 9:48 AM  Performed by: Elby Raelene SAUNDERS, CRNAPre-anesthesia Checklist: Patient identified, Emergency Drugs available, Suction available and Patient being monitored Patient Re-evaluated:Patient Re-evaluated prior to induction Oxygen Delivery Method: Circle System Utilized Preoxygenation: Pre-oxygenation with 100% oxygen Induction Type: IV induction Ventilation: Mask ventilation without difficulty Laryngoscope Size: Glidescope and 3 Grade View: Grade I Tube type: Oral Tube size: 6.5 mm Number of attempts: 1 Airway Equipment and Method: Stylet and Bite block Placement Confirmation: ETT inserted through vocal cords under direct vision, positive ETCO2 and breath sounds checked- equal and bilateral Secured at: 21 cm Tube secured with: Tape Dental Injury: Teeth and Oropharynx as per pre-operative assessment

## 2024-12-28 ENCOUNTER — Encounter (HOSPITAL_COMMUNITY): Payer: Self-pay | Admitting: Cardiology

## 2024-12-28 ENCOUNTER — Telehealth (HOSPITAL_COMMUNITY): Payer: Self-pay

## 2024-12-28 MED FILL — Fentanyl Citrate Preservative Free (PF) Inj 100 MCG/2ML: INTRAMUSCULAR | Qty: 2 | Status: AC

## 2024-12-28 NOTE — Telephone Encounter (Signed)
 Spoke with patient to complete post procedure follow up call.  Patient reports no complications with groin sites.   Instructions reviewed with patient:  Remove large bandage at puncture site after 24 hours. It is normal to have bruising, tenderness, mild swelling, and a pea or marble sized lump/knot at the groin site which can take up to three months to resolve.  Get help right away if you notice sudden swelling at the puncture site.  Check your puncture site every day for signs of infection: fever, redness, swelling, pus drainage, warmth, foul odor or excessive pain. If this occurs, please call [phone], to speak with the RN Navigator. Get help right away if your puncture site is bleeding and the bleeding does not stop after applying firm pressure to the area.  You may continue to have skipped beats/ atrial fibrillation during the first several months after your procedure.  It is very important not to miss any doses of your blood thinner Eliquis .    You will follow up with the Afib clinic 4 weeks after your procedure and follow up with the Afib clinic 3 months after your procedure.  Activity restrictions reviewed.  Patient verbalized understanding to all instructions provided.

## 2024-12-28 NOTE — Anesthesia Postprocedure Evaluation (Signed)
"   Anesthesia Post Note  Patient: Jeanne Wilson  Procedure(s) Performed: ATRIAL FIBRILLATION ABLATION     Patient location during evaluation: Cath Lab Anesthesia Type: General Level of consciousness: awake and alert Pain management: pain level controlled Vital Signs Assessment: post-procedure vital signs reviewed and stable Respiratory status: spontaneous breathing, nonlabored ventilation and respiratory function stable Cardiovascular status: blood pressure returned to baseline and stable Postop Assessment: no apparent nausea or vomiting Anesthetic complications: no   There were no known notable events for this encounter.  Last Vitals:  Vitals:   12/27/24 1400 12/27/24 1428  BP: (!) 104/52 (!) 104/50  Pulse: 65 63  Resp: 13 11  Temp:    SpO2: 93% 95%    Last Pain:  Vitals:   12/27/24 1428  TempSrc:   PainSc: 0-No pain                 Garnette FORBES Skillern      "

## 2025-01-10 ENCOUNTER — Other Ambulatory Visit (HOSPITAL_COMMUNITY): Payer: Self-pay

## 2025-01-12 ENCOUNTER — Other Ambulatory Visit: Payer: Self-pay

## 2025-01-12 ENCOUNTER — Other Ambulatory Visit (HOSPITAL_COMMUNITY): Payer: Self-pay

## 2025-01-13 ENCOUNTER — Ambulatory Visit: Payer: Self-pay | Admitting: Cardiology

## 2025-01-24 ENCOUNTER — Ambulatory Visit (HOSPITAL_COMMUNITY)
Admission: RE | Admit: 2025-01-24 | Discharge: 2025-01-24 | Disposition: A | Discharge status: Home / Self Care | Source: Ambulatory Visit | Attending: Nurse Practitioner | Admitting: Nurse Practitioner

## 2025-01-24 VITALS — BP 128/62 | HR 58 | Ht 64.0 in | Wt 158.6 lb

## 2025-01-24 DIAGNOSIS — I4819 Other persistent atrial fibrillation: Secondary | ICD-10-CM

## 2025-01-24 DIAGNOSIS — D6869 Other thrombophilia: Secondary | ICD-10-CM

## 2025-01-24 DIAGNOSIS — I4891 Unspecified atrial fibrillation: Secondary | ICD-10-CM | POA: Diagnosis not present

## 2025-01-24 DIAGNOSIS — Z79899 Other long term (current) drug therapy: Secondary | ICD-10-CM

## 2025-01-24 DIAGNOSIS — Z5181 Encounter for therapeutic drug level monitoring: Secondary | ICD-10-CM | POA: Diagnosis not present

## 2025-01-24 NOTE — Progress Notes (Signed)
 "   Primary Care Physician: Katina Pfeiffer, PA-C Primary Cardiologist: Redell Shallow, MD Electrophysiologist: Will Gladis Norton, MD     Referring Physician: Dr. Norton Jeanne Wilson is a 74 y.o. female with a history of HTN, chronic systolic HF, wide-complex tachycardia potentially related to aberrant atrial arrhythmia, HLD, and persistent atrial fibrillation who presents for consultation in the [facility]. Patient is on Eliquis  for stroke prevention.  On evaluation today, patient is currently in NSR. S/p Afib ablation on 12/27/2024 by Dr. Norton. No episodes of Afib since ablation.  She is taking amiodarone  200 mg daily.  She is currently taking an antibiotic for a UTI.  No chest pain or SOB. Leg sites healed without issue. No missed doses of anticoagulant.  Today, she denies symptoms of orthopnea, PND, lower extremity edema, dizziness, presyncope, syncope, snoring, daytime somnolence, bleeding, or neurologic sequela. The patient is tolerating medications without difficulties and is otherwise without complaint today.    she has a BMI of Body mass index is 27.22 kg/m.SABRA Filed Weights   01/24/25 1321  Weight: 71.9 kg    Current Outpatient Medications  Medication Sig Dispense Refill   acetaminophen  (TYLENOL ) 500 MG tablet Take 500-1,000 mg by mouth every 6 (six) hours as needed (pain.). (Patient taking differently: Take 500-1,000 mg by mouth as needed (pain.).)     amiodarone  (PACERONE ) 200 MG tablet Take 1 tablet (200 mg total) by mouth 2 (two) times daily for 14 days, THEN 1 tablet (200 mg total) daily thereafter, (Patient taking differently: Taking 1 tablet daily) 90 tablet 3   apixaban  (ELIQUIS ) 5 MG TABS tablet Take 1 tablet (5 mg total) by mouth 2 (two) times daily. 60 tablet 11   atorvastatin  (LIPITOR) 10 MG tablet Take 10 mg by mouth every morning.     CALCIUM -MAGNESIUM-ZINC PO Take 1 capsule by mouth daily in the afternoon.     carvedilol   (COREG ) 3.125 MG tablet Take 1 tablet (3.125 mg total) by mouth 2 (two) times daily with a meal. 60 tablet 11   cephALEXin (KEFLEX) 500 MG capsule Take 500 mg by mouth 2 (two) times daily.     Glucosamine HCl (GLUCOSAMINE PO) Take 2 tablets by mouth daily in the afternoon.     ibandronate  (BONIVA ) 150 MG tablet Take 1 tablet (150 mg total) by mouth  every 30 (thirty) days. Take 60 minutes before first food in the morning on an empty stomach. Take with a full glass of water and do not take anything else by mouth or lie down for the next 30 min. 12 tablet 1   losartan  (COZAAR ) 25 MG tablet Take 1 tablet (25 mg total) by mouth daily. 90 tablet 3   nitroGLYCERIN  (NITROSTAT ) 0.4 MG SL tablet Place 1 tablet (0.4 mg total) under the tongue every 5 (five) minutes as needed. 25 tablet 6   valACYclovir (VALTREX) 1000 MG tablet Take 1,000 mg by mouth 2 (two) times daily as needed (Flare Ups).     zolpidem  (AMBIEN ) 5 MG tablet Take 1 tablet (5 mg total) by mouth at bedtime. 30 tablet 5   No current facility-administered medications for this encounter.    Atrial Fibrillation Management history:  Previous antiarrhythmic drugs: Amiodarone  Previous cardioversions: 06/03/24 Previous ablations: 12/27/2024 Anticoagulation history: Eliquis    ROS- All systems are reviewed and negative except as per the HPI above.  Physical Exam: BP 128/62   Pulse (!) 58   Ht 5' 4 (1.626 m)  Wt 71.9 kg   BMI 27.22 kg/m   GEN: Well nourished, well developed in no acute distress NECK: No JVD; No carotid bruits CARDIAC: Regular rate and rhythm, no murmurs, rubs, gallops RESPIRATORY:  Clear to auscultation without rales, wheezing or rhonchi  ABDOMEN: Soft, non-tender, non-distended EXTREMITIES:  No edema; No deformity   EKG today demonstrates   EKG Interpretation Date/Time:  Tuesday January 24 2025 13:31:22 EST Ventricular Rate:  58 PR Interval:  220 QRS Duration:  98 QT Interval:  456 QTC Calculation: 447 R  Axis:   -30  Text Interpretation: Sinus bradycardia with 1st degree A-V block Left axis deviation Left ventricular hypertrophy with repolarization abnormality ( R in aVL , Cornell product , Romhilt-Estes ) Abnormal ECG When compared with ECG of 27-Dec-2024 10:59, PREVIOUS ECG IS PRESENT Confirmed by Terra Pac ([id number]) on 01/24/2025 2:01:27 PM        Echo 11/29/24 demonstrated   1. Left ventricular ejection fraction, by estimation, is 50 to 55%. The  left ventricle has low normal function. The left ventricle has no regional  wall motion abnormalities. Left ventricular diastolic parameters are  indeterminate. The average left  ventricular global longitudinal strain is -18.0 %. The global longitudinal  strain is normal.   2. Right ventricular systolic function is normal. The right ventricular  size is normal. There is mildly elevated pulmonary artery systolic  pressure.   3. Left atrial size was moderately dilated.   4. The mitral valve is normal in structure. Mild mitral valve  regurgitation. No evidence of mitral stenosis.   5. The aortic valve is tricuspid. Aortic valve regurgitation is not  visualized. No aortic stenosis is present.   6. The inferior vena cava is normal in size with greater than 50%  respiratory variability, suggesting right atrial pressure of 3 mmHg.   ASSESSMENT & PLAN CHA2DS2-VASc Score = 4  The patient's score is based upon: CHF History: 1 HTN History: 1 Diabetes History: 0 Stroke History: 0 Vascular Disease History: 0 Age Score: 1 Gender Score: 1       ASSESSMENT AND PLAN: Persistent Atrial Fibrillation (ICD10:  I48.19) The patient's CHA2DS2-VASc score is 4, indicating a 4.8% annual risk of stroke.   S/p A-fib ablation on 12/27/2024  Patient is currently in NSR.  We discussed what to expect during the recovery period following ablation.  Continue current medication regimen without any changes at this time.  If patient has low A-fib burden during the  blanking period, anticipate discontinuation of amiodarone  at upcoming office visit.  High risk medication monitoring (ICD10: J342684) Patient requires ongoing monitoring for anti-arrhythmic medication which has the potential to cause life threatening arrhythmias or AV block. Qtc stable. Continue amiodarone  200 mg daily.   Secondary Hypercoagulable State (ICD10:  D68.69) The patient is at significant risk for stroke/thromboembolism based upon her CHA2DS2-VASc Score of 4.  Continue Apixaban  (Eliquis ).  Continue Eliquis  5 mg twice daily without interruption in the blanking period.    Follow up with A-fib clinic as scheduled.   Terra Pac, [facility] [address] [place], [place] [address] [phone]  "

## 2025-03-24 ENCOUNTER — Ambulatory Visit: Admitting: Cardiology

## 2025-03-28 ENCOUNTER — Ambulatory Visit (HOSPITAL_COMMUNITY): Admitting: Nurse Practitioner
